# Patient Record
Sex: Male | Born: 1975 | Race: Black or African American | Hispanic: No | Marital: Single | Smoking: Current every day smoker
Health system: Southern US, Community
[De-identification: ages and names within clinical notes are randomized; demographics above are authoritative.]

---

## 2000-11-13 ENCOUNTER — Emergency Department (HOSPITAL_COMMUNITY): Admission: EM | Admit: 2000-11-13 | Discharge: 2000-11-13 | Payer: Self-pay | Admitting: Emergency Medicine

## 2003-10-28 ENCOUNTER — Emergency Department (HOSPITAL_COMMUNITY): Admission: AD | Admit: 2003-10-28 | Discharge: 2003-10-28 | Payer: Self-pay | Admitting: Family Medicine

## 2003-12-11 ENCOUNTER — Emergency Department (HOSPITAL_COMMUNITY): Admission: AD | Admit: 2003-12-11 | Discharge: 2003-12-11 | Payer: Self-pay | Admitting: Family Medicine

## 2005-08-12 ENCOUNTER — Emergency Department (HOSPITAL_COMMUNITY): Admission: EM | Admit: 2005-08-12 | Discharge: 2005-08-12 | Payer: Self-pay | Admitting: Family Medicine

## 2006-02-22 ENCOUNTER — Encounter: Admission: RE | Admit: 2006-02-22 | Discharge: 2006-02-22 | Payer: Self-pay | Admitting: Family Medicine

## 2006-02-27 ENCOUNTER — Ambulatory Visit (HOSPITAL_COMMUNITY): Admission: RE | Admit: 2006-02-27 | Discharge: 2006-02-27 | Payer: Self-pay | Admitting: Orthopedic Surgery

## 2006-03-13 ENCOUNTER — Inpatient Hospital Stay (HOSPITAL_COMMUNITY): Admission: RE | Admit: 2006-03-13 | Discharge: 2006-03-15 | Payer: Self-pay | Admitting: Psychiatry

## 2006-03-14 ENCOUNTER — Ambulatory Visit: Payer: Self-pay | Admitting: Psychiatry

## 2006-03-26 ENCOUNTER — Encounter: Admission: RE | Admit: 2006-03-26 | Discharge: 2006-03-26 | Payer: Self-pay | Admitting: Orthopedic Surgery

## 2007-09-20 ENCOUNTER — Emergency Department (HOSPITAL_COMMUNITY): Admission: EM | Admit: 2007-09-20 | Discharge: 2007-09-20 | Payer: Self-pay | Admitting: Emergency Medicine

## 2008-01-13 ENCOUNTER — Emergency Department (HOSPITAL_COMMUNITY): Admission: EM | Admit: 2008-01-13 | Discharge: 2008-01-13 | Payer: Self-pay | Admitting: Emergency Medicine

## 2008-09-23 ENCOUNTER — Emergency Department (HOSPITAL_COMMUNITY): Admission: EM | Admit: 2008-09-23 | Discharge: 2008-09-23 | Payer: Self-pay | Admitting: *Deleted

## 2008-10-27 ENCOUNTER — Emergency Department (HOSPITAL_COMMUNITY): Admission: EM | Admit: 2008-10-27 | Discharge: 2008-10-28 | Payer: Self-pay | Admitting: Emergency Medicine

## 2008-10-28 ENCOUNTER — Emergency Department (HOSPITAL_COMMUNITY): Admission: EM | Admit: 2008-10-28 | Discharge: 2008-10-28 | Payer: Self-pay | Admitting: Family Medicine
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
## 2011-02-23 NOTE — Op Note (Signed)
Danny English, Danny English       ACCOUNT NO.:  [id number]   MEDICAL RECORD NO.:  [id number]          PATIENT TYPE:  AMB   LOCATION:  SDS                          FACILITY:  [facility]   PHYSICIAN:  Myrtie Neither, MD      DATE OF BIRTH:  1975/11/05   DATE OF PROCEDURE:  02/27/2006  DATE OF DISCHARGE:                                 OPERATIVE REPORT   PREOPERATIVE DIAGNOSIS:  Fractured right fifth metacarpal.   POSTOPERATIVE DIAGNOSIS:  Fractured right fifth metacarpal.   ANESTHESIA:  General.   PROCEDURE:  Open reduction internal fixation with mini fragment five-hole  plate.   The patient was taking to the operating room, was given adequate  preoperative medications, given general anesthesia and intubated.  Right  hand was prepped with DuraPrep and draped in a sterile manner.  Tourniquet  and bipolar used for hemostasis.  Longitudinal incision was made over the  fifth metacarpal.  Sharp and blunt dissection made down to the fracture.  Manipulated reduction of the fracture site was done, stabilized with a K-  wire, followed by placement of dorsal mini-fragment plate with four screws.  This was visualized directly as well as with the mini C-arm.  Anatomic  reduction was possible.  Copious irrigation was done followed by wound  closure with 4-0 nylon.  Compressive dressing was applied and forearm splint  applied.  The patient tolerated the procedure quite well and went to  recovery room in stable and satisfactory position.  The patient is being  discharged home, ice packs, elevation, Percocet 5 mg q.4h. p.r.n. for pain,  return to the office in 1 week.      Myrtie Neither, MD  Electronically Signed     AC/MEDQ  D:  02/27/2006  T:  02/28/2006  Job:  [id number]

## 2011-04-27 ENCOUNTER — Inpatient Hospital Stay (INDEPENDENT_AMBULATORY_CARE_PROVIDER_SITE_OTHER)
Admission: RE | Admit: 2011-04-27 | Discharge: 2011-04-27 | Disposition: A | Discharge status: Home / Self Care | Payer: Medicare Other | Source: Ambulatory Visit | Attending: Family Medicine | Admitting: Family Medicine

## 2011-04-27 DIAGNOSIS — R350 Frequency of micturition: Secondary | ICD-10-CM

## 2011-04-27 LAB — POCT URINALYSIS DIP (DEVICE)
Bilirubin Urine: NEGATIVE
Leukocytes, UA: NEGATIVE
Specific Gravity, Urine: 1.025 (ref 1.005–1.030)
Urobilinogen, UA: 1 mg/dL (ref 0.0–1.0)

## 2011-10-05 ENCOUNTER — Emergency Department (HOSPITAL_COMMUNITY): Admission: EM | Admit: 2011-10-05 | Discharge: 2011-10-05 | Payer: Self-pay | Source: Home / Self Care

## 2015-11-22 DIAGNOSIS — F319 Bipolar disorder, unspecified: Secondary | ICD-10-CM | POA: Diagnosis not present

## 2015-11-22 DIAGNOSIS — J300 Vasomotor rhinitis: Secondary | ICD-10-CM | POA: Diagnosis not present

## 2015-11-22 DIAGNOSIS — F17200 Nicotine dependence, unspecified, uncomplicated: Secondary | ICD-10-CM | POA: Diagnosis not present

## 2015-11-22 DIAGNOSIS — F209 Schizophrenia, unspecified: Secondary | ICD-10-CM | POA: Diagnosis not present

## 2017-12-28 ENCOUNTER — Other Ambulatory Visit: Payer: Self-pay

## 2017-12-28 ENCOUNTER — Emergency Department (HOSPITAL_COMMUNITY): Payer: Medicare Other

## 2017-12-28 ENCOUNTER — Emergency Department (HOSPITAL_COMMUNITY)
Admission: EM | Admit: 2017-12-28 | Discharge: 2017-12-28 | Disposition: A | Discharge status: Home / Self Care | Payer: Medicare Other | Attending: Emergency Medicine | Admitting: Emergency Medicine

## 2017-12-28 ENCOUNTER — Encounter (HOSPITAL_COMMUNITY): Payer: Self-pay | Admitting: *Deleted

## 2017-12-28 DIAGNOSIS — R109 Unspecified abdominal pain: Secondary | ICD-10-CM

## 2017-12-28 DIAGNOSIS — R1909 Other intra-abdominal and pelvic swelling, mass and lump: Secondary | ICD-10-CM | POA: Diagnosis not present

## 2017-12-28 DIAGNOSIS — R1030 Lower abdominal pain, unspecified: Secondary | ICD-10-CM | POA: Insufficient documentation

## 2017-12-28 DIAGNOSIS — R030 Elevated blood-pressure reading, without diagnosis of hypertension: Secondary | ICD-10-CM | POA: Insufficient documentation

## 2017-12-28 DIAGNOSIS — R1032 Left lower quadrant pain: Secondary | ICD-10-CM

## 2017-12-28 LAB — CBC WITH DIFFERENTIAL/PLATELET
BASOS ABS: 0.1 10*3/uL (ref 0.0–0.1)
Basophils Relative: 0 %
EOS ABS: 0.7 10*3/uL (ref 0.0–0.7)
EOS PCT: 5 %
HCT: 43.7 % (ref 39.0–52.0)
Hemoglobin: 14.3 g/dL (ref 13.0–17.0)
Lymphocytes Relative: 21 %
Lymphs Abs: 2.6 10*3/uL (ref 0.7–4.0)
MCH: 30.1 pg (ref 26.0–34.0)
MCHC: 32.7 g/dL (ref 30.0–36.0)
MCV: 92 fL (ref 78.0–100.0)
MONO ABS: 0.9 10*3/uL (ref 0.1–1.0)
Monocytes Relative: 8 %
Neutro Abs: 8.2 10*3/uL — ABNORMAL HIGH (ref 1.7–7.7)
Neutrophils Relative %: 66 %
PLATELETS: 282 10*3/uL (ref 150–400)
RBC: 4.75 MIL/uL (ref 4.22–5.81)
RDW: 14.1 % (ref 11.5–15.5)
WBC: 12.5 10*3/uL — AB (ref 4.0–10.5)

## 2017-12-28 LAB — BASIC METABOLIC PANEL
Anion gap: 12 (ref 5–15)
BUN: 11 mg/dL (ref 6–20)
CO2: 23 mmol/L (ref 22–32)
CREATININE: 1.12 mg/dL (ref 0.61–1.24)
Calcium: 9.4 mg/dL (ref 8.9–10.3)
Chloride: 104 mmol/L (ref 101–111)
GFR calc Af Amer: >60 mL/min (ref >60)
GFR calc non Af Amer: >60 mL/min (ref >60)
GLUCOSE: 90 mg/dL (ref 65–99)
Potassium: 3.7 mmol/L (ref 3.5–5.1)
SODIUM: 139 mmol/L (ref 135–145)

## 2017-12-28 LAB — URINALYSIS, ROUTINE W REFLEX MICROSCOPIC
Bilirubin Urine: NEGATIVE
GLUCOSE, UA: NEGATIVE mg/dL
HGB URINE DIPSTICK: NEGATIVE
KETONES UR: NEGATIVE mg/dL
LEUKOCYTES UA: NEGATIVE
Nitrite: NEGATIVE
PROTEIN: NEGATIVE mg/dL
Specific Gravity, Urine: 1.019 (ref 1.005–1.030)
pH: 6 (ref 5.0–8.0)

## 2017-12-28 MED ORDER — CEFTRIAXONE SODIUM 250 MG IJ SOLR
250.0000 mg | Freq: Once | INTRAMUSCULAR | Status: AC
  Administered 2017-12-28: 250 mg via INTRAMUSCULAR
  Filled 2017-12-28: qty 250

## 2017-12-28 MED ORDER — METHOCARBAMOL 500 MG PO TABS: 500.0000 mg | ORAL_TABLET | Freq: Two times a day (BID) | ORAL | 0 refills | Status: DC

## 2017-12-28 MED ORDER — IOPAMIDOL (ISOVUE-300) INJECTION 61%
INTRAVENOUS | Status: AC
  Administered 2017-12-28: 100 mL
  Filled 2017-12-28: qty 100

## 2017-12-28 MED ORDER — KETOROLAC TROMETHAMINE 30 MG/ML IJ SOLN
30.0000 mg | Freq: Once | INTRAMUSCULAR | Status: AC
  Administered 2017-12-28: 30 mg via INTRAVENOUS
  Filled 2017-12-28: qty 1

## 2017-12-28 MED ORDER — LIDOCAINE HCL (PF) 1 % IJ SOLN
INTRAMUSCULAR | Status: AC
  Filled 2017-12-28: qty 5

## 2017-12-28 MED ORDER — DOXYCYCLINE HYCLATE 100 MG PO CAPS: 100.0000 mg | ORAL_CAPSULE | Freq: Two times a day (BID) | ORAL | 0 refills | Status: AC

## 2017-12-28 NOTE — ED Triage Notes (Signed)
Pt c/o L groin pain onset last night. Denies any heavy lifting; no urinary issues. No hx of hernias or kidney stones

## 2017-12-28 NOTE — Discharge Instructions (Addendum)
Please see the information and instructions below regarding your visit.  Your diagnoses today include:  1. Left inguinal pain   2. Flank pain   3. Elevated blood pressure reading without diagnosis of hypertension    Tests performed today include: See side panel of your discharge paperwork for testing performed today. Vital signs are listed at the bottom of these instructions.   Your CT scan is normal today with the exception of a slightly enlarged prostate.  Please see your results.  Medications prescribed:    Take any prescribed medications only as prescribed, and any over the counter medications only as directed on the packaging.  Please take all of your antibiotics until finished.   You may develop abdominal discomfort or nausea from the antibiotic. If this occurs, you may take it with food. Some patients also get diarrhea with antibiotics. You may help offset this with probiotics which you can buy or get in yogurt. Do not eat or take the probiotics until 2 hours after your antibiotic. Some women develop vaginal yeast infections after antibiotics. If you develop unusual vaginal discharge after being on this medication, please see your primary care provider.   Some people develop allergies to antibiotics. Symptoms of antibiotic allergy can be mild and include a flat rash and itching. They can also be more serious and include:  ?Hives - Hives are raised, red patches of skin that are usually very itchy.  ?Lip or tongue swelling  ?Trouble swallowing or breathing  ?Blistering of the skin or mouth.  If you have any of these serious symptoms, please seek emergency medical care immediately.   You are prescribed Robaxin, a muscle relaxant. Some common side effects of this medication include:  Feeling sleepy.  Dizziness. Take care upon going from a seated to a standing position.  Dry mouth.  Feeling tired or weak.  Hard stools (constipation).  Upset stomach. These are not all of the  side effects that may occur. If you have questions about side effects, call your doctor. Call your primary care provider for medical advice about side effects.  This medication can be sedating. Only take this medication as needed. Please do not combine with alcohol. Do not drive or operate machinery while taking this medication.   This medication can interact with some other medications. Make sure to tell any provider you are taking this medication before they prescribe you a new medication.    Home care instructions:   Low back pain gets worse the longer you stay stationary. Please keep moving and walking as tolerated. There are exercises included in this packet to perform as tolerated for your low back pain.   Apply heat to the areas that are painful. Avoid twisting or bending your trunk to lift something. Do not lift anything above 25 lbs while recovering from this flare of low back pain.  Please follow any educational materials contained in this packet.   You may also use Salon PAS patches on your back.  Follow-up instructions: Please follow-up with your primary care provider as soon as possible for further evaluation of your symptoms if they are not completely improved.   To find a primary care or specialty doctor please call [phone] or [phone] to access " Find a Doctor Service."  You may also go on the [facility] website at [email or web address]  There are also multiple [facility] practices throughout the [place] that are frequently accepting new patients. You may find a clinic that is close  to your home and contact them.  [facility] [address] [place] [phone]  [facility] in [place] (also locations in [place] and [place]) - [address] [place] [phone]  [facility] - [address] [place]  [phone]   Please follow up with  Dr. Liliane ShiWinter in Urology regarding your prostate enlargement.  Return instructions:  Please return to the Emergency Department if you experience worsening symptoms.  Please return for any fever or chills in the setting of your back pain, weakness in the muscles of the legs, numbness in your legs and feet that is new or changing, numbness in the area where you wipe, retention of your urine, loss of bowel or bladder control, or problems with walking. Please return with any pain with bowel movements, swelling in your testicles, or worsening lower belly pain. Please return if you have any other emergent concerns.  Additional Information:   Your vital signs today were: BP (!) 166/102    Pulse 80    Resp 16    SpO2 99%  If your blood pressure (BP) was elevated on multiple readings during this visit above 130 for the top number or above 80 for the bottom number, please have this repeated by your primary care provider within one month. --------------  Thank you for allowing us to participate in your care today.

## 2017-12-28 NOTE — ED Provider Notes (Signed)
[facility] Provider Note   CSN: [id number] Arrival date & time: 12/28/17  [id number]     History   Chief Complaint Chief Complaint  Patient presents with  . Groin Pain    HPI Danny English is a 42 y.o. male.  HPI  Patient is a 42 year old male with a history of psychotic disorder (denies any current treatment regimen) presenting for left groin pain.  Patient reports he has had intermittent left flank to left groin pain for the past 3 months, however last night it became severe during sleep.  Patient reports that movement makes the pain worse.  Patient denies any abdominal pain, dysuria, testicular pain, swelling or erythema, or rectal pain.  Patient denies nausea or vomiting.  Patient denies fever or chills.  Patient denies hematuria.  Patient reports he is monogamous with one male sexual partner.  Patient denies any saddle anesthesia, loss of bowel or bladder control, extremity weakness or numbness, IVDU, or history of cancer.  History reviewed. No pertinent past medical history.  There are no active problems to display for this patient.   History reviewed. No pertinent surgical history.      Home Medications    Prior to Admission medications   Not on File    Family History No family history on file.  Social History Social History   Tobacco Use  . Smoking status: Never Smoker  . Smokeless tobacco: Never Used  Substance Use Topics  . Alcohol use: Yes  . Drug use: Yes    Types: Marijuana     Allergies   Patient has no known allergies.   Review of Systems Review of Systems  Constitutional: Negative for chills and fever.  Respiratory: Negative for shortness of breath.   Cardiovascular: Negative for chest pain.  Gastrointestinal: Negative for abdominal pain, nausea and vomiting.  Genitourinary: Positive for flank pain. Negative for discharge, hematuria, penile swelling, scrotal swelling, testicular pain and  urgency.  Musculoskeletal: Positive for arthralgias and back pain. Negative for gait problem.  Neurological: Negative for weakness and numbness.  All other systems reviewed and are negative.    Physical Exam Updated Vital Signs BP (!) 171/92   Pulse 85   Resp 16   SpO2 99%   Physical Exam  Constitutional: He appears well-developed and well-nourished. No distress.  HENT:  Head: Normocephalic and atraumatic.  Mouth/Throat: Oropharynx is clear and moist.  Eyes: Pupils are equal, round, and reactive to light. Conjunctivae and EOM are normal.  Neck: Normal range of motion. Neck supple.  Cardiovascular: Normal rate, regular rhythm, S1 normal and S2 normal.  No murmur heard. Pulmonary/Chest: Effort normal and breath sounds normal. He has no wheezes. He has no rales.  Abdominal: Soft. He exhibits no distension. There is no tenderness. There is no guarding.  Genitourinary:  Genitourinary Comments: Examination performed with nurse chaperone, Chestine Sporelark, present.  There are no masses or hernias of the inguinal region.  No direct or indirect hernias identified.  No masses or tenderness of bilateral testes.  No penile discharge or tenderness.  Musculoskeletal: Normal range of motion. He exhibits no edema or deformity.  Spine Exam: Inspection/Palpation: No midline tenderness to palpation of cervical, thoracic, or lumbar spine.  Mild left paraspinal muscular tenderness. Strength: 5/5 throughout LE bilaterally (hip flexion/extension, adduction/abduction; knee flexion/extension; foot dorsiflexion/plantarflexion, inversion/eversion; great toe inversion) Sensation: Intact to light touch in proximal and distal LE bilaterally Reflexes: 2+ quadriceps and achilles reflexes Normal and symmetric gait.   Neurological: He  is alert.  Cranial nerves grossly intact. Patient moves extremities symmetrically and with good coordination.  Skin: Skin is warm and dry. No rash noted. No erythema.  Psychiatric: He has a  normal mood and affect. His behavior is normal. Judgment and thought content normal.  Nursing note and vitals reviewed.    ED Treatments / Results  Labs (all labs ordered are listed, but only abnormal results are displayed) Labs Reviewed - No data to display  EKG None  Radiology No results found.  Procedures Procedures (including critical care time)  Medications Ordered in ED Medications - No data to display   Initial Impression / Assessment and Plan / ED Course  I have reviewed the triage vital signs and the nursing notes.  Pertinent labs & imaging results that were available during my care of the patient were reviewed by me and considered in my medical decision making (see chart for details).  Clinical Course as of Dec 29 1250  Sat Dec 28, 2017  1042 Reassessed.  Patient reports he did have some improvement with Toradol, but made him nauseous.  Patient reports he still has some pain in the groin with movement.   [AM]  3674 42 year old, here in the ED with some left-sided groin pain.  His urinalysis and lab work were unremarkable other than slightly elevated WBCs.  He is nontoxic-appearing.  He had a CT which just demonstrated some prostatic enlargement.  Will need pain control plus minus antibiotics and outpatient follow-up with PCP/urology.   [MB]  1251 Return precautions were given for any weakness in the extremities, numbness, saddle anesthesia, loss of bowel or bladder control, fevers, chills, lower abdominal pain increasing, or testicular pain.   [AM]    Clinical Course User Index [AM] Elisha Ponder, PA-C [MB] Terrilee Files, MD    Patient is nontoxic-appearing, afebrile, in no acute distress.  Differential diagnosis includes nephrolithiasis, inguinal hernia, inguinal lymphadenopathy, musculoskeletal strain, nerve entrapment of femoral region, STI, lumbar radiculopathy.  Do not suspect cauda equina or abscess as cause of patient's symptoms given chronicity as  well as nontoxic appearance.  Patient is also neurologically intact in the lower extremities.  Patient has a slight leukocytosis of 12.5 with no clear source.  Slight left shift with 8.2.  Given these findings, in conjunction with patient's symptoms and prostatic enlargement on CT, will treat for prostatitis.  Patient was then apparently evaluated by Dr. Meridee Score, who agrees.  Patient to follow-up with urology.  Also placed case management consult to assist patient in finding a primary care provider, as his is no longer in practice.   Blood pressure noted to be elevated today.  I noted this to patient, and he will follow-up with primary care.  This is a shared visit with Dr. Meridee Score. Patient was independently evaluated by this attending physician. Attending physician consulted in evaluation and discharge management.  Final Clinical Impressions(s) / ED Diagnoses   Final diagnoses:  Flank pain  Left inguinal pain  Elevated blood pressure reading without diagnosis of hypertension    ED Discharge Orders        Ordered    doxycycline (VIBRAMYCIN) 100 MG capsule  2 times daily     12/28/17 1245    methocarbamol (ROBAXIN) 500 MG tablet  2 times daily     12/28/17 1245       Delia Chimes 12/28/17 1253    Terrilee Files, MD 12/30/17 1349

## 2017-12-29 LAB — URINE CULTURE: Culture: NO GROWTH

## 2018-01-20 DIAGNOSIS — N50812 Left testicular pain: Secondary | ICD-10-CM | POA: Diagnosis not present

## 2018-01-20 DIAGNOSIS — R351 Nocturia: Secondary | ICD-10-CM | POA: Diagnosis not present

## 2018-01-20 DIAGNOSIS — N401 Enlarged prostate with lower urinary tract symptoms: Secondary | ICD-10-CM | POA: Diagnosis not present

## 2018-01-20 DIAGNOSIS — R3912 Poor urinary stream: Secondary | ICD-10-CM | POA: Diagnosis not present

## 2018-01-21 ENCOUNTER — Ambulatory Visit (INDEPENDENT_AMBULATORY_CARE_PROVIDER_SITE_OTHER): Payer: Medicare Other | Admitting: Nurse Practitioner

## 2018-01-21 ENCOUNTER — Other Ambulatory Visit: Payer: Self-pay

## 2018-01-21 ENCOUNTER — Encounter (INDEPENDENT_AMBULATORY_CARE_PROVIDER_SITE_OTHER): Payer: Self-pay | Admitting: Nurse Practitioner

## 2018-01-21 VITALS — BP 130/84 | HR 98 | Temp 99.8°F | Ht 65.75 in | Wt 228.2 lb

## 2018-01-21 DIAGNOSIS — R1032 Left lower quadrant pain: Secondary | ICD-10-CM

## 2018-01-21 DIAGNOSIS — B9789 Other viral agents as the cause of diseases classified elsewhere: Secondary | ICD-10-CM

## 2018-01-21 DIAGNOSIS — J029 Acute pharyngitis, unspecified: Secondary | ICD-10-CM

## 2018-01-21 DIAGNOSIS — J028 Acute pharyngitis due to other specified organisms: Secondary | ICD-10-CM

## 2018-01-21 DIAGNOSIS — F17200 Nicotine dependence, unspecified, uncomplicated: Secondary | ICD-10-CM | POA: Insufficient documentation

## 2018-01-21 LAB — POCT RAPID STREP A (OFFICE): RAPID STREP A SCREEN: NEGATIVE

## 2018-01-21 NOTE — Patient Instructions (Addendum)
Health Maintenance, Male A healthy lifestyle and preventive care is important for your health and wellness. Ask your health care provider about what schedule of regular examinations is right for you. What should I know about weight and diet? Eat a Healthy Diet  Eat plenty of vegetables, fruits, whole grains, low-fat dairy products, and lean protein.  Do not eat a lot of foods high in solid fats, added sugars, or salt.  Maintain a Healthy Weight Regular exercise can help you achieve or maintain a healthy weight. You should:  Do at least 150 minutes of exercise each week. The exercise should increase your heart rate and make you sweat (moderate-intensity exercise).  Do strength-training exercises at least twice a week.  Watch Your Levels of Cholesterol and Blood Lipids  Have your blood tested for lipids and cholesterol every 5 years starting at 42 years of age. If you are at high risk for heart disease, you should start having your blood tested when you are 42 years old. You may need to have your cholesterol levels checked more often if: ? Your lipid or cholesterol levels are high. ? You are older than 42 years of age. ? You are at high risk for heart disease.  What should I know about cancer screening? Many types of cancers can be detected early and may often be prevented. Lung Cancer  You should be screened every year for lung cancer if: ? You are a current smoker who has smoked for at least 30 years. ? You are a former smoker who has quit within the past 15 years.  Talk to your health care provider about your screening options, when you should start screening, and how often you should be screened.  Colorectal Cancer  Routine colorectal cancer screening usually begins at 42 years of age and should be repeated every 5-10 years until you are 42 years old. You may need to be screened more often if early forms of precancerous polyps or small growths are found. Your health care provider  may recommend screening at an earlier age if you have risk factors for colon cancer.  Your health care provider may recommend using home test kits to check for hidden blood in the stool.  A small camera at the end of a tube can be used to examine your colon (sigmoidoscopy or colonoscopy). This checks for the earliest forms of colorectal cancer.  Prostate and Testicular Cancer  Depending on your age and overall health, your health care provider may do certain tests to screen for prostate and testicular cancer.  Talk to your health care provider about any symptoms or concerns you have about testicular or prostate cancer.  Skin Cancer  Check your skin from head to toe regularly.  Tell your health care provider about any new moles or changes in moles, especially if: ? There is a change in a mole's size, shape, or color. ? You have a mole that is larger than a pencil eraser.  Always use sunscreen. Apply sunscreen liberally and repeat throughout the day.  Protect yourself by wearing long sleeves, pants, a wide-brimmed hat, and sunglasses when outside.  What should I know about heart disease, diabetes, and high blood pressure?  If you are 18-39 years of age, have your blood pressure checked every 3-5 years. If you are 40 years of age or older, have your blood pressure checked every year. You should have your blood pressure measured twice-once when you are at a hospital or clinic, and once when   you are not at a hospital or clinic. Record the average of the two measurements. To check your blood pressure when you are not at a hospital or clinic, you can use: ? An automated blood pressure machine at a pharmacy. ? A home blood pressure monitor.  Talk to your health care provider about your target blood pressure.  If you are between 6945-708 years old, ask your health care provider if you should take aspirin to prevent heart disease.  Have regular diabetes screenings by checking your fasting blood  sugar level. ? If you are at a normal weight and have a low risk for diabetes, have this test once every three years after the age of 845. ? If you are overweight and have a high risk for diabetes, consider being tested at a younger age or more often.  A one-time screening for abdominal aortic aneurysm (AAA) by ultrasound is recommended for men aged 65-75 years who are current or former smokers. What should I know about preventing infection? Hepatitis B If you have a higher risk for hepatitis B, you should be screened for this virus. Talk with your health care provider to find out if you are at risk for hepatitis B infection. Hepatitis C Blood testing is recommended for:  Everyone born from [birth year] through 1965.  Anyone with known risk factors for hepatitis C.  Sexually Transmitted Diseases (STDs)  You should be screened each year for STDs including gonorrhea and chlamydia if: ? You are sexually active and are younger than 42 years of age. ? You are older than 42 years of age and your health care provider tells you that you are at risk for this type of infection. ? Your sexual activity has changed since you were last screened and you are at an increased risk for chlamydia or gonorrhea. Ask your health care provider if you are at risk.  Talk with your health care provider about whether you are at high risk of being infected with HIV. Your health care provider may recommend a prescription medicine to help prevent HIV infection.  What else can I do?  Schedule regular health, dental, and eye exams.  Stay current with your vaccines (immunizations).  Do not use any tobacco products, such as cigarettes, chewing tobacco, and e-cigarettes. If you need help quitting, ask your health care provider.  Limit alcohol intake to no more than 2 drinks per day. One drink equals 12 ounces of beer, 5 ounces of wine, or 1 ounces of hard liquor.  Do not use street drugs.  Do not share needles.  Ask your  health care provider for help if you need support or information about quitting drugs.  Tell your health care provider if you often feel depressed.  Tell your health care provider if you have ever been abused or do not feel safe at home. This information is not intended to replace advice given to you by your health care provider. Make sure you discuss any questions you have with your health care provider. Document Released: 03/22/2008 Document Revised: 05/23/2016 Document Reviewed: 06/28/2015 Elsevier Interactive Patient Education  2018 [employer]  Sore Throat When you have a sore throat, your throat may:  Hurt.  Burn.  Feel irritated.  Feel scratchy.  Many things can cause a sore throat, including:  An infection.  Allergies.  Dryness in the air.  Smoke or pollution.  Gastroesophageal reflux disease (GERD).  A tumor.  A sore throat can be the first sign of another sickness.  It can happen with other problems, like coughing or a fever. Most sore throats go away without treatment. Follow these instructions at home:  Take over-the-counter medicines only as told by your doctor.  Drink enough fluids to keep your pee (urine) clear or pale yellow.  Rest when you feel you need to.  To help with pain, try: ? Sipping warm liquids, such as broth, herbal tea, or warm water. ? Eating or drinking cold or frozen liquids, such as frozen ice pops. ? Gargling with a salt-water mixture 3-4 times a day or as needed. To make a salt-water mixture, add -1 tsp of salt in 1 cup of warm water. Mix it until you cannot see the salt anymore. ? Sucking on hard candy or throat lozenges. ? Putting a cool-mist humidifier in your bedroom at night. ? Sitting in the bathroom with the door closed for 5-10 minutes while you run hot water in the shower.  Do not use any tobacco products, such as cigarettes, chewing tobacco, and e-cigarettes. If you need help quitting, ask your doctor. Contact a  doctor if:  You have a fever for more than 2-3 days.  You keep having symptoms for more than 2-3 days.  Your throat does not get better in 7 days.  You have a fever and your symptoms suddenly get worse. Get help right away if:  You have trouble breathing.  You cannot swallow fluids, soft foods, or your saliva.  You have swelling in your throat or neck that gets worse.  You keep feeling like you are going to throw up (vomit).  You keep throwing up. This information is not intended to replace advice given to you by your health care provider. Make sure you discuss any questions you have with your health care provider. Document Released: 07/03/2008 Document Revised: 05/20/2016 Document Reviewed: 07/15/2015 Elsevier Interactive Patient Education  [employer].

## 2018-01-21 NOTE — Progress Notes (Signed)
Assessment & Plan:  [name] was seen today for hospitalization follow-up.  Diagnoses and all orders for this visit:  Left inguinal pain Continue follow up with Urology as instructed   Tobacco dependence Danny English was counseled on the dangers of tobacco use, and was advised to quit. Reviewed strategies to maximize success, including removing cigarettes and smoking materials from environment, stress management and support of family/friends as well as pharmacological alternatives including: Wellbutrin, Chantix, Nicotine patch, Nicotine gum or lozenges. Smoking cessation support: smoking cessation hotline: 1-800-QUIT-NOW.  Smoking cessation classes are also available through [facility]. Call [phone] or visit our website at [email or web address].   A total of 3 minutes was spent on counseling for smoking cessation and Danny English is not ready to quit.   Sore throat (viral) -     Rapid Strep A Warm salt water gargles for pain relief May alternate with acetaminophen and ibuprofen for symptom relief     Patient has been counseled on age-appropriate routine health concerns for screening and prevention. These are reviewed and up-to-date. Referrals have been placed accordingly. Immunizations are up-to-date or declined.    Subjective:   Chief Complaint  Patient presents with  . Hospitalization Follow-up    left inguinal pain/ elevated BP   HPI [place] Danny English 42 y.o. male presents to office today for hospital follow up to groin pain with onset over 3 months prior to ED visit on 12-28-2017. He also endorses myalgias, cough, sore throat. He has taken XS Tylenol OTC for pain relief.  Groin Pain CT in ED showed prostatic enlargement. He was given pain medication, antibiotics and instructed to follow up with Primary care/Urology. Today he reports his inguinal pain has improved and he is currently seeing Urology and taking tamsulosin.   Sore  Throat Patient complains of sore throat. Symptoms began 4 days ago. Pain is of moderate severity. Fever is believed to be present, temp not taken. Other associated symptoms have included cough, nasal congestion, myalgias.  Fluid intake is good.  There has not been contact with an individual with known strep.  Current medications include acetaminophen, multi-symptom cold medications.       Review of Systems  Constitutional: Positive for fever and malaise/fatigue. Negative for weight loss.  HENT: Positive for congestion, hearing loss and sore throat. Negative for nosebleeds.   Eyes: Negative.  Negative for blurred vision, double vision and photophobia.  Respiratory: Positive for cough. Negative for shortness of breath.   Cardiovascular: Negative.  Negative for chest pain, palpitations and leg swelling.  Gastrointestinal: Negative.  Negative for heartburn, nausea and vomiting.  Musculoskeletal: Positive for myalgias.  Neurological: Negative.  Negative for dizziness, focal weakness, seizures and headaches.  Psychiatric/Behavioral: Negative.  Negative for suicidal ideas.    No past medical history on file.  No past surgical history on file.  Family History  Problem Relation Age of Onset  . Cancer Mother   . Breast cancer Mother     Social History Reviewed with no changes to be made today.   Outpatient Medications Prior to Visit  Medication Sig Dispense Refill  . doxycycline (VIBRAMYCIN) 100 MG capsule Take 100 mg by mouth 2 (two) times daily.    . methocarbamol (ROBAXIN) 500 MG tablet Take 1 tablet (500 mg total) by mouth 2 (two) times daily. 14 tablet 0  . tamsulosin (FLOMAX) 0.4 MG CAPS capsule Take 0.4 mg by mouth daily.     No facility-administered medications prior to visit.  No Known Allergies     Objective:    BP 130/84 (BP Location: Right Arm, Patient Position: Sitting, Cuff Size: Large)   Pulse 98   Temp 99.8 F (37.7 C) (Oral)   Wt 228 lb 3.2 oz (103.5 kg)    SpO2 95%  Wt Readings from Last 3 Encounters:  01/21/18 228 lb 3.2 oz (103.5 kg)    Physical Exam  Constitutional: He is oriented to person, place, and time. He appears well-developed and well-nourished. He is cooperative.  HENT:  Head: Normocephalic and atraumatic.  Right Ear: Hearing, tympanic membrane, external ear and ear canal normal.  Left Ear: Hearing, tympanic membrane, external ear and ear canal normal.  Nose: Mucosal edema and rhinorrhea present.  Mouth/Throat: Oropharyngeal exudate, posterior oropharyngeal edema and posterior oropharyngeal erythema present.  Eyes: EOM are normal.  Neck: Normal range of motion.  Cardiovascular: Normal rate, regular rhythm and normal heart sounds. Exam reveals no gallop and no friction rub.  No murmur heard. Pulmonary/Chest: Effort normal and breath sounds normal. No tachypnea. No respiratory distress. He has no decreased breath sounds. He has no wheezes. He has no rhonchi. He has no rales. He exhibits no tenderness.  Abdominal: Soft. Bowel sounds are normal.  Musculoskeletal: Normal range of motion. He exhibits no edema.  Neurological: He is alert and oriented to person, place, and time. Coordination normal.  Skin: Skin is warm and dry.  Psychiatric: He has a normal mood and affect. His behavior is normal. Judgment and thought content normal.  Nursing note and vitals reviewed.        Patient has been counseled extensively about nutrition and exercise as well as the importance of adherence with medications and regular follow-up. The patient was given clear instructions to go to ER or return to medical center if symptoms don't improve, worsen or new problems develop. The patient verbalized understanding.   Follow-up: Return in about 3 months (around 04/22/2018) for FASTING labs and Physical.   Claiborne RiggZelda W May Manrique, FNP-BC [facility] [place], [place] [phone]   01/21/2018, 3:10 PM

## 2018-03-11 DIAGNOSIS — R3912 Poor urinary stream: Secondary | ICD-10-CM | POA: Diagnosis not present

## 2018-03-11 DIAGNOSIS — N401 Enlarged prostate with lower urinary tract symptoms: Secondary | ICD-10-CM | POA: Diagnosis not present

## 2018-03-11 DIAGNOSIS — R351 Nocturia: Secondary | ICD-10-CM | POA: Diagnosis not present

## 2018-04-22 ENCOUNTER — Other Ambulatory Visit: Payer: Self-pay

## 2018-04-22 ENCOUNTER — Ambulatory Visit (INDEPENDENT_AMBULATORY_CARE_PROVIDER_SITE_OTHER): Payer: Medicare Other | Admitting: Physician Assistant

## 2018-04-22 ENCOUNTER — Encounter (INDEPENDENT_AMBULATORY_CARE_PROVIDER_SITE_OTHER): Payer: Self-pay | Admitting: Physician Assistant

## 2018-04-22 VITALS — BP 149/90 | HR 71 | Temp 98.1°F | Ht 65.0 in | Wt 228.4 lb

## 2018-04-22 DIAGNOSIS — Z23 Encounter for immunization: Secondary | ICD-10-CM | POA: Diagnosis not present

## 2018-04-22 DIAGNOSIS — R030 Elevated blood-pressure reading, without diagnosis of hypertension: Secondary | ICD-10-CM

## 2018-04-22 DIAGNOSIS — Z0000 Encounter for general adult medical examination without abnormal findings: Secondary | ICD-10-CM | POA: Diagnosis not present

## 2018-04-22 DIAGNOSIS — Z114 Encounter for screening for human immunodeficiency virus [HIV]: Secondary | ICD-10-CM

## 2018-04-22 NOTE — Patient Instructions (Signed)

## 2018-04-22 NOTE — Progress Notes (Signed)
   Subjective:  Patient ID: Danny English, male    DOB: 05/28/1976  Age: 42 y.o. MRN: [id number]  CC: annual exam  HPI Danny English is a 42 y.o. male with no significant medical history presents for an annual physical. States he is feeling well. Denies any pains, symptoms, or complaints.       Outpatient Medications Prior to Visit  Medication Sig Dispense Refill  . doxycycline (VIBRAMYCIN) 100 MG capsule Take 100 mg by mouth 2 (two) times daily.    . methocarbamol (ROBAXIN) 500 MG tablet Take 1 tablet (500 mg total) by mouth 2 (two) times daily. 14 tablet 0  . tamsulosin (FLOMAX) 0.4 MG CAPS capsule Take 0.4 mg by mouth daily.     No facility-administered medications prior to visit.      ROS Review of Systems  Constitutional: Negative for chills, fever and malaise/fatigue.  Eyes: Negative for blurred vision.  Respiratory: Negative for shortness of breath.   Cardiovascular: Negative for chest pain and palpitations.  Gastrointestinal: Negative for abdominal pain and nausea.  Genitourinary: Negative for dysuria and hematuria.  Musculoskeletal: Negative for joint pain and myalgias.  Skin: Negative for rash.  Neurological: Negative for tingling and headaches.  Psychiatric/Behavioral: Negative for depression. The patient is not nervous/anxious.     Objective:  BP (!) 149/90 (BP Location: Right Arm, Patient Position: Supine, Cuff Size: Large)   Pulse 71   Temp 98.1 F (36.7 C) (Oral)   Ht 5\' 5"  (1.651 m)   Wt 228 lb 6.4 oz (103.6 kg)   SpO2 96%   BMI 38.01 kg/m   BP/Weight 04/22/2018 01/21/2018 12/28/2017  Systolic BP 149 130 129  Diastolic BP 90 84 86  Wt. (Lbs) 228.4 228.2 -  BMI 38.01 37.12 -      Physical Exam  Constitutional: He is oriented to person, place, and time.  Well developed, obese, NAD, polite  HENT:  Head: Normocephalic and atraumatic.  Eyes: Conjunctivae and EOM are normal. No scleral icterus.  Neck: Normal range of motion. Neck  supple. No thyromegaly present.  Cardiovascular: Normal rate, regular rhythm, normal heart sounds and intact distal pulses. Exam reveals no gallop and no friction rub.  No murmur heard. Pulmonary/Chest: Effort normal and breath sounds normal. No stridor. No respiratory distress. He has no wheezes. He has no rales.  Abdominal: Soft. Bowel sounds are normal. He exhibits no distension and no mass. There is no tenderness. There is no rebound and no guarding.  Genitourinary:  Genitourinary Comments: Pt declined  Musculoskeletal: He exhibits no edema.  Normal aROM of UEs, LEs, and back.  Lymphadenopathy:    He has no cervical adenopathy.  Neurological: He is alert and oriented to person, place, and time. No cranial nerve deficit.  Strength 5/5 bilaterally   Skin: Skin is warm and dry. No rash noted. No erythema. No pallor.  Psychiatric: He has a normal mood and affect. His behavior is normal. Thought content normal.  Vitals reviewed.    Assessment & Plan:   1. Annual physical exam - Comprehensive metabolic panel - Lipid panel; Future  2. Screening for HIV (human immunodeficiency virus) - HIV antibody  3. Need for Tdap vaccination - Tdap vaccine greater than or equal to 7yo IM  4. Elevated blood pressure reading without diagnosis of hypertension     Follow-up: PRN, BP check   Loletta Specteroger David Adair Lemar PA

## 2018-04-23 ENCOUNTER — Telehealth (INDEPENDENT_AMBULATORY_CARE_PROVIDER_SITE_OTHER): Payer: Self-pay

## 2018-04-23 LAB — COMPREHENSIVE METABOLIC PANEL
ALBUMIN: 4.1 g/dL (ref 3.5–5.5)
ALT: 19 IU/L (ref 0–44)
AST: 21 IU/L (ref 0–40)
Albumin/Globulin Ratio: 1.7 (ref 1.2–2.2)
Alkaline Phosphatase: 96 IU/L (ref 39–117)
BUN / CREAT RATIO: 7 — AB (ref 9–20)
BUN: 9 mg/dL (ref 6–24)
Bilirubin Total: 0.3 mg/dL (ref 0.0–1.2)
CALCIUM: 9.1 mg/dL (ref 8.7–10.2)
CO2: 21 mmol/L (ref 20–29)
CREATININE: 1.22 mg/dL (ref 0.76–1.27)
Chloride: 104 mmol/L (ref 96–106)
GFR calc Af Amer: 84 mL/min/{1.73_m2} (ref >59)
GFR, EST NON AFRICAN AMERICAN: 73 mL/min/{1.73_m2} (ref >59)
GLOBULIN, TOTAL: 2.4 g/dL (ref 1.5–4.5)
Glucose: 81 mg/dL (ref 65–99)
Potassium: 3.8 mmol/L (ref 3.5–5.2)
SODIUM: 144 mmol/L (ref 134–144)
TOTAL PROTEIN: 6.5 g/dL (ref 6.0–8.5)

## 2018-04-23 LAB — HIV ANTIBODY (ROUTINE TESTING W REFLEX): HIV Screen 4th Generation wRfx: NONREACTIVE

## 2018-04-23 NOTE — Telephone Encounter (Signed)
-----   Message from Loletta Specteroger David Gomez, PA-C sent at 04/23/2018  1:34 PM EDT ----- HIV negative. Kidney and liver results normal.

## 2018-04-23 NOTE — Telephone Encounter (Signed)
Patient is aware of negative HIV and normal liver and kidney. Maryjean Mornempestt S Roberts, CMA

## 2018-05-27 ENCOUNTER — Ambulatory Visit (INDEPENDENT_AMBULATORY_CARE_PROVIDER_SITE_OTHER): Payer: Medicare Other | Admitting: Physician Assistant

## 2018-06-24 ENCOUNTER — Ambulatory Visit (INDEPENDENT_AMBULATORY_CARE_PROVIDER_SITE_OTHER): Payer: Medicare Other | Admitting: Physician Assistant

## 2018-06-24 ENCOUNTER — Other Ambulatory Visit: Payer: Self-pay

## 2018-06-24 ENCOUNTER — Encounter (INDEPENDENT_AMBULATORY_CARE_PROVIDER_SITE_OTHER): Payer: Self-pay | Admitting: Physician Assistant

## 2018-06-24 VITALS — BP 129/82 | HR 71 | Temp 98.1°F | Ht 65.0 in | Wt 234.4 lb

## 2018-06-24 DIAGNOSIS — Z0130 Encounter for examination of blood pressure without abnormal findings: Secondary | ICD-10-CM | POA: Diagnosis not present

## 2018-06-24 DIAGNOSIS — Z23 Encounter for immunization: Secondary | ICD-10-CM | POA: Diagnosis not present

## 2018-06-24 NOTE — Progress Notes (Signed)
   Subjective:  Patient ID: Danny English, male    DOB: 12/24/1975  Age: 42 y.o. MRN: [id number]  CC: f/u BP recheck  HPI Danny English is a [age] y.o. male with no significant medical history presents on f/u of elevated blood pressure reading without diagnosis of hypertension. Last BP 149/90 mmHg two months ago. BP 129/82 mmHg today. Says he is feeling well and does not endorse any symptoms or complaints.      ROS Review of Systems  Constitutional: Negative for chills, fever and malaise/fatigue.  Eyes: Negative for blurred vision.  Respiratory: Negative for shortness of breath.   Cardiovascular: Negative for chest pain and palpitations.  Gastrointestinal: Negative for abdominal pain and nausea.  Genitourinary: Negative for dysuria and hematuria.  Musculoskeletal: Negative for joint pain and myalgias.  Skin: Negative for rash.  Neurological: Negative for tingling and headaches.  Psychiatric/Behavioral: Negative for depression. The patient is not nervous/anxious.     Objective:  BP 134/86 (BP Location: Right Arm, Patient Position: Sitting, Cuff Size: Large)   Pulse 71   Temp 98.1 F (36.7 C) (Oral)   Ht 5\' 5"  (1.651 m)   Wt 234 lb 6.4 oz (106.3 kg)   SpO2 92%   BMI 39.01 kg/m   Vitals:   06/24/18 1429 06/24/18 1444  BP: 134/86 129/82  Pulse: 71   Temp: 98.1 F (36.7 C)   TempSrc: Oral   SpO2: 92%   Weight: 234 lb 6.4 oz (106.3 kg)   Height: 5\' 5"  (1.651 m)       Physical Exam  Constitutional: He is oriented to person, place, and time.  Well developed, obese, NAD, polite  HENT:  Head: Normocephalic and atraumatic.  Eyes: No scleral icterus.  Neck: Normal range of motion. Neck supple. No thyromegaly present.  Cardiovascular: Normal rate, regular rhythm and normal heart sounds.  Pulmonary/Chest: Effort normal and breath sounds normal.  Abdominal: Soft. Bowel sounds are normal. There is no tenderness.  Musculoskeletal: He exhibits no edema.   Neurological: He is alert and oriented to person, place, and time.  Skin: Skin is warm and dry. No rash noted. No erythema. No pallor.  Psychiatric: He has a normal mood and affect. His behavior is normal. Thought content normal.  Vitals reviewed.    Assessment & Plan:   1. Blood pressure check - Pt not hypertensive. BP 129/82 today. Advised to lose weight and eat a low sodium diet to further improve blood pressure.   2. Need for prophylactic vaccination and inoculation against influenza - Flu Vaccine QUAD 6+ mos PF IM (Fluarix Quad PF)    Follow-up: Return in about 10 months (around 04/24/2019) for annual physical.   Loletta Specteroger David Levon Boettcher PA

## 2018-06-24 NOTE — Patient Instructions (Signed)
DASH Eating Plan DASH stands for "Dietary Approaches to Stop Hypertension." The DASH eating plan is a healthy eating plan that has been shown to reduce high blood pressure (hypertension). It may also reduce your risk for type 2 diabetes, heart disease, and stroke. The DASH eating plan may also help with weight loss. What are tips for following this plan? General guidelines  Avoid eating more than 2,300 mg (milligrams) of salt (sodium) a day. If you have hypertension, you may need to reduce your sodium intake to 1,500 mg a day.  Limit alcohol intake to no more than 1 drink a day for nonpregnant women and 2 drinks a day for men. One drink equals 12 oz of beer, 5 oz of wine, or 1 oz of hard liquor.  Work with your health care provider to maintain a healthy body weight or to lose weight. Ask what an ideal weight is for you.  Get at least 30 minutes of exercise that causes your heart to beat faster (aerobic exercise) most days of the week. Activities may include walking, swimming, or biking.  Work with your health care provider or diet and nutrition specialist (dietitian) to adjust your eating plan to your individual calorie needs. Reading food labels  Check food labels for the amount of sodium per serving. Choose foods with less than 5 percent of the Daily Value of sodium. Generally, foods with less than 300 mg of sodium per serving fit into this eating plan.  To find whole grains, look for the word "whole" as the first word in the ingredient list. Shopping  Buy products labeled as "low-sodium" or "no salt added."  Buy fresh foods. Avoid canned foods and premade or frozen meals. Cooking  Avoid adding salt when cooking. Use salt-free seasonings or herbs instead of table salt or sea salt. Check with your health care provider or pharmacist before using salt substitutes.  Do not fry foods. Cook foods using healthy methods such as baking, boiling, grilling, and broiling instead.  Cook with  heart-healthy oils, such as olive, canola, soybean, or sunflower oil. Meal planning   Eat a balanced diet that includes: ? 5 or more servings of fruits and vegetables each day. At each meal, try to fill half of your plate with fruits and vegetables. ? Up to 6-8 servings of whole grains each day. ? Less than 6 oz of lean meat, poultry, or fish each day. A 3-oz serving of meat is about the same size as a deck of cards. One egg equals 1 oz. ? 2 servings of low-fat dairy each day. ? A serving of nuts, seeds, or beans 5 times each week. ? Heart-healthy fats. Healthy fats called Omega-3 fatty acids are found in foods such as flaxseeds and coldwater fish, like sardines, salmon, and mackerel.  Limit how much you eat of the following: ? Canned or prepackaged foods. ? Food that is high in trans fat, such as fried foods. ? Food that is high in saturated fat, such as fatty meat. ? Sweets, desserts, sugary drinks, and other foods with added sugar. ? Full-fat dairy products.  Do not salt foods before eating.  Try to eat at least 2 vegetarian meals each week.  Eat more home-cooked food and less restaurant, buffet, and fast food.  When eating at a restaurant, ask that your food be prepared with less salt or no salt, if possible. What foods are recommended? The items listed may not be a complete list. Talk with your dietitian about what   dietary choices are best for you. Grains Whole-grain or whole-wheat bread. Whole-grain or whole-wheat pasta. Brown rice. Oatmeal. Quinoa. Bulgur. Whole-grain and low-sodium cereals. Pita bread. Low-fat, low-sodium crackers. Whole-wheat flour tortillas. Vegetables Fresh or frozen vegetables (raw, steamed, roasted, or grilled). Low-sodium or reduced-sodium tomato and vegetable juice. Low-sodium or reduced-sodium tomato sauce and tomato paste. Low-sodium or reduced-sodium canned vegetables. Fruits All fresh, dried, or frozen fruit. Canned fruit in natural juice (without  added sugar). Meat and other protein foods Skinless chicken or turkey. Ground chicken or turkey. Pork with fat trimmed off. Fish and seafood. Egg whites. Dried beans, peas, or lentils. Unsalted nuts, nut butters, and seeds. Unsalted canned beans. Lean cuts of beef with fat trimmed off. Low-sodium, lean deli meat. Dairy Low-fat (1%) or fat-free (skim) milk. Fat-free, low-fat, or reduced-fat cheeses. Nonfat, low-sodium ricotta or cottage cheese. Low-fat or nonfat yogurt. Low-fat, low-sodium cheese. Fats and oils Soft margarine without trans fats. Vegetable oil. Low-fat, reduced-fat, or light mayonnaise and salad dressings (reduced-sodium). Canola, safflower, olive, soybean, and sunflower oils. Avocado. Seasoning and other foods Herbs. Spices. Seasoning mixes without salt. Unsalted popcorn and pretzels. Fat-free sweets. What foods are not recommended? The items listed may not be a complete list. Talk with your dietitian about what dietary choices are best for you. Grains Baked goods made with fat, such as croissants, muffins, or some breads. Dry pasta or rice meal packs. Vegetables Creamed or fried vegetables. Vegetables in a cheese sauce. Regular canned vegetables (not low-sodium or reduced-sodium). Regular canned tomato sauce and paste (not low-sodium or reduced-sodium). Regular tomato and vegetable juice (not low-sodium or reduced-sodium). Pickles. Olives. Fruits Canned fruit in a light or heavy syrup. Fried fruit. Fruit in cream or butter sauce. Meat and other protein foods Fatty cuts of meat. Ribs. Fried meat. Bacon. Sausage. Bologna and other processed lunch meats. Salami. Fatback. Hotdogs. Bratwurst. Salted nuts and seeds. Canned beans with added salt. Canned or smoked fish. Whole eggs or egg yolks. Chicken or turkey with skin. Dairy Whole or 2% milk, cream, and half-and-half. Whole or full-fat cream cheese. Whole-fat or sweetened yogurt. Full-fat cheese. Nondairy creamers. Whipped toppings.  Processed cheese and cheese spreads. Fats and oils Butter. Stick margarine. Lard. Shortening. Ghee. Bacon fat. Tropical oils, such as coconut, palm kernel, or palm oil. Seasoning and other foods Salted popcorn and pretzels. Onion salt, garlic salt, seasoned salt, table salt, and sea salt. Worcestershire sauce. Tartar sauce. Barbecue sauce. Teriyaki sauce. Soy sauce, including reduced-sodium. Steak sauce. Canned and packaged gravies. Fish sauce. Oyster sauce. Cocktail sauce. Horseradish that you find on the shelf. Ketchup. Mustard. Meat flavorings and tenderizers. Bouillon cubes. Hot sauce and Tabasco sauce. Premade or packaged marinades. Premade or packaged taco seasonings. Relishes. Regular salad dressings. Where to find more information:  National Heart, Lung, and Blood Institute: www.nhlbi.nih.gov  American Heart Association: www.heart.org Summary  The DASH eating plan is a healthy eating plan that has been shown to reduce high blood pressure (hypertension). It may also reduce your risk for type 2 diabetes, heart disease, and stroke.  With the DASH eating plan, you should limit salt (sodium) intake to 2,300 mg a day. If you have hypertension, you may need to reduce your sodium intake to 1,500 mg a day.  When on the DASH eating plan, aim to eat more fresh fruits and vegetables, whole grains, lean proteins, low-fat dairy, and heart-healthy fats.  Work with your health care provider or diet and nutrition specialist (dietitian) to adjust your eating plan to your individual   calorie needs. This information is not intended to replace advice given to you by your health care provider. Make sure you discuss any questions you have with your health care provider. Document Released: 09/13/2011 Document Revised: 09/17/2016 Document Reviewed: 09/17/2016 Elsevier Interactive Patient Education  2018 Elsevier Inc.  

## 2019-03-28 ENCOUNTER — Emergency Department (HOSPITAL_COMMUNITY): Payer: Medicare HMO

## 2019-03-28 ENCOUNTER — Emergency Department (HOSPITAL_COMMUNITY)
Admission: EM | Admit: 2019-03-28 | Discharge: 2019-03-28 | Disposition: A | Discharge status: Home / Self Care | Payer: Medicare HMO | Attending: Emergency Medicine | Admitting: Emergency Medicine

## 2019-03-28 ENCOUNTER — Other Ambulatory Visit: Payer: Self-pay

## 2019-03-28 ENCOUNTER — Encounter (HOSPITAL_COMMUNITY): Payer: Self-pay

## 2019-03-28 DIAGNOSIS — R0789 Other chest pain: Secondary | ICD-10-CM | POA: Diagnosis not present

## 2019-03-28 DIAGNOSIS — Z20828 Contact with and (suspected) exposure to other viral communicable diseases: Secondary | ICD-10-CM | POA: Insufficient documentation

## 2019-03-28 DIAGNOSIS — R69 Illness, unspecified: Secondary | ICD-10-CM | POA: Diagnosis not present

## 2019-03-28 DIAGNOSIS — R079 Chest pain, unspecified: Secondary | ICD-10-CM | POA: Diagnosis not present

## 2019-03-28 DIAGNOSIS — F17210 Nicotine dependence, cigarettes, uncomplicated: Secondary | ICD-10-CM | POA: Insufficient documentation

## 2019-03-28 LAB — BASIC METABOLIC PANEL
Anion gap: 3 — ABNORMAL LOW (ref 5–15)
BUN: 17 mg/dL (ref 6–20)
CO2: 29 mmol/L (ref 22–32)
Calcium: 9 mg/dL (ref 8.9–10.3)
Chloride: 110 mmol/L (ref 98–111)
Creatinine, Ser: 1.26 mg/dL — ABNORMAL HIGH (ref 0.61–1.24)
GFR calc Af Amer: >60 mL/min (ref >60)
GFR calc non Af Amer: >60 mL/min (ref >60)
Glucose, Bld: 76 mg/dL (ref 70–99)
Potassium: 4 mmol/L (ref 3.5–5.1)
Sodium: 142 mmol/L (ref 135–145)

## 2019-03-28 LAB — CBC
HCT: 46.3 % (ref 39.0–52.0)
Hemoglobin: 14.4 g/dL (ref 13.0–17.0)
MCH: 28.7 pg (ref 26.0–34.0)
MCHC: 31.1 g/dL (ref 30.0–36.0)
MCV: 92.2 fL (ref 80.0–100.0)
Platelets: 248 10*3/uL (ref 150–400)
RBC: 5.02 MIL/uL (ref 4.22–5.81)
RDW: 14.8 % (ref 11.5–15.5)
WBC: 7.8 10*3/uL (ref 4.0–10.5)
nRBC: 0 % (ref 0.0–0.2)

## 2019-03-28 LAB — TROPONIN I
Troponin I: <0.03 ng/mL (ref <0.03)
Troponin I: <0.03 ng/mL (ref <0.03)

## 2019-03-28 MED ORDER — DICLOFENAC SODIUM 1 % TD GEL: 2.0000 g | Freq: Four times a day (QID) | TRANSDERMAL | 0 refills | Status: AC

## 2019-03-28 MED ORDER — SODIUM CHLORIDE 0.9% FLUSH: 3.0000 mL | Freq: Once | INTRAVENOUS | Status: DC

## 2019-03-28 NOTE — Discharge Instructions (Addendum)
Use voltaren gel as needed for pain.  You are being tested for coronavirus. Results should return in about 2 days. If positive, you should receive a phone call. If negative, you will not. Either way, you may check online on MyChart.  You should quarantine until results return.  If positive, as long as you are fever free you can end your quarantine.  Follow up with your primary care doctor if your pain is not improving.  Stop using cocaine, this may be causing your chest pain to be worse.  Return to the ER with any new, worsening, or concerning symptoms.

## 2019-03-28 NOTE — ED Triage Notes (Signed)
He reports left-sided chest pain, intermittently x 1 week, which is gradually increasing in frequency and severity. He denies fever, nor any other sign of illness. He tells me that with certain torso movements, the discomfort increases. EKG performed at triage.

## 2019-03-29 NOTE — ED Provider Notes (Addendum)
[facility] DEPT Provider Note   CSN: [id number] Arrival date & time: 03/28/19  1521     History   Chief Complaint Chief Complaint  Patient presents with  . Chest Pain    HPI Danny English is a 43 y.o. male presenting for evaluation of cp.   Pt states for the past week has has been having intermittent CP.  Pain is more likely to occur when he is leaning forward, moving his arm, and moving his torso.  However it does occasionally occur at rest.  Pain lasts for anywhere from several seconds to a minute, then resolves without intervention.  Pain is always in the same location, on the left side.  He denies associated shortness of breath, nausea, vomiting, or diaphoresis.  He denies recent fevers, chills, cough, abdominal pain, urinary symptoms, normal bowel movements.  He denies a previous cardiac history.  He denies a family history of heart problems.  Patient smokes cigarettes daily.  He also reports marijuana and cocaine use, last used 2 days ago.  He reports occasional alcohol intake, no increase in this past week.  He denies change in activity recently, no increase with exertion.  No increased pain with inspiration or exertion.  He has not taken anything for pain.  He states he has no medical problems, takes no medications daily. No pain currently at rest.     HPI  History reviewed. No pertinent past medical history.  Patient Active Problem List   Diagnosis Date Noted  . Left inguinal pain 01/21/2018  . Tobacco dependence 01/21/2018    No past surgical history on file.      Home Medications    Prior to Admission medications   Medication Sig Start Date End Date Taking? Authorizing Provider  diclofenac sodium (VOLTAREN) 1 % GEL Apply 2 g topically 4 (four) times daily. 03/28/19   Obinna Ehresman, PA-C    Family History Family History  Problem Relation Age of Onset  . Cancer Mother   . Breast cancer Mother     Social History  Social History   Tobacco Use  . Smoking status: Current Every Day Smoker    Types: Cigarettes  . Smokeless tobacco: Never Used  Substance Use Topics  . Alcohol use: Yes  . Drug use: Yes    Types: Marijuana     Allergies   Patient has no known allergies.   Review of Systems Review of Systems  Cardiovascular: Positive for chest pain (Intermittent for the past week).  All other systems reviewed and are negative.    Physical Exam Updated Vital Signs BP (!) 142/87 (BP Location: Left Arm)   Pulse 86   Temp 98 F (36.7 C) (Oral)   Resp 18   SpO2 100%   Physical Exam Vitals signs and nursing note reviewed.  Constitutional:      General: He is not in acute distress.    Appearance: He is well-developed.     Comments: Resting comfortably in the bed in no acute distress  HENT:     Head: Normocephalic and atraumatic.  Eyes:     Conjunctiva/sclera: Conjunctivae normal.     Pupils: Pupils are equal, round, and reactive to light.  Neck:     Musculoskeletal: Normal range of motion and neck supple.  Cardiovascular:     Rate and Rhythm: Normal rate and regular rhythm.     Pulses: Normal pulses.  Pulmonary:     Effort: Pulmonary effort is normal. No respiratory distress.  Breath sounds: Normal breath sounds. No wheezing.     Comments: Speaking in full sentences.  Clear lung sounds in all fields.  No tenderness palpation of the chest wall. Chest:     Chest wall: No tenderness.  Abdominal:     General: There is no distension.     Palpations: Abdomen is soft. There is no mass.     Tenderness: There is no abdominal tenderness. There is no guarding or rebound.  Musculoskeletal: Normal range of motion.  Skin:    General: Skin is warm and dry.     Capillary Refill: Capillary refill takes less than 2 seconds.  Neurological:     Mental Status: He is alert and oriented to person, place, and time.      ED Treatments / Results  Labs (all labs ordered are listed, but only  abnormal results are displayed) Labs Reviewed  BASIC METABOLIC PANEL - Abnormal; Notable for the following components:      Result Value   Creatinine, Ser 1.26 (*)    Anion gap 3 (*)    All other components within normal limits  NOVEL CORONAVIRUS, NAA (HOSPITAL ORDER, SEND-OUT TO REF LAB)  CBC  TROPONIN I  TROPONIN I    EKG EKG Interpretation  Date/Time:  Saturday March 28 2019 15:33:35 EDT Ventricular Rate:  74 PR Interval:    QRS Duration: 76 QT Interval:  376 QTC Calculation: 418 R Axis:   46 Text Interpretation:  Sinus rhythm Probable left atrial enlargement Abnormal T, consider ischemia, diffuse leads new t wave inversions compared with prior 4/09 Confirmed by Meridee ScoreButler, Michael [id number]) on 03/28/2019 3:38:54 PM   Radiology Dg Chest 2 View  Result Date: 03/28/2019 CLINICAL DATA:  Left-sided chest pain. EXAM: CHEST - 2 VIEW COMPARISON:  01/13/2008 FINDINGS: Cardiomediastinal silhouette is normal. Mediastinal contours appear intact. Subtle peribronchial airspace opacities in the left lower lobe. Osseous structures are without acute abnormality. Soft tissues are grossly normal. IMPRESSION: Subtle peribronchial airspace opacities in the left lower lobe may represent atelectasis or atypical/viral pneumonia. Electronically Signed   By: Ted Mcalpineobrinka  Dimitrova M.D.   On: 03/28/2019 16:17    Procedures Procedures (including critical care time)  Medications Ordered in ED Medications - No data to display   Initial Impression / Assessment and Plan / ED Course  I have reviewed the triage vital signs and the nursing notes.  Pertinent labs & imaging results that were available during my care of the patient were reviewed by me and considered in my medical decision making (see chart for details).        Patient presenting for evaluation 1 week history of intermittent chest pain.  Physical exam reassuring, he appears nontoxic.  Pain is not reproducible with the chest wall, however patient  states it is more likely to occur with movement.  As such, consider MSK cause.  Patient does have a history of cocaine use, consider vasospasm as cause.  Upon my initial evaluation the patient, labs, x-ray, and EKG have already been done.  Labs reassuring and that troponin is negative.  Mild elevation in creatinine, though not far from patient's baseline.  X-ray viewed interpreted by me, no obvious pneumonia.  Per radiologist read, concern for atelectasis versus an atypical/viral pneumonia.  Patient without cough, fever, shortness of breath.  Doubt pna.  No sxs consistent with covid.  EKG change from previous, but no STEMI.  doubt PE. Case discussed with attending, Dr. Rubin PayorPickering agrees to plan.  As EKG has been change,  will obtain delta troponin.  As chest x-ray shows possible atypical/viral pneumonia, will perform send out COVID test for completeness.  Delta troponin negative.  Discussed findings with patient.  Discussed treatment with potential for possible inflammatory cause.  Discussed importance of quarantine until coronavirus results have returned.  Encourage follow-up with primary care symptoms not proving.  At this time, patient appears safe for discharge.  Return precautions given.  Patient states he understands and agrees to plan.  Danny English was evaluated in Emergency Department on 03/29/2019 for the symptoms described in the history of present illness. He was evaluated in the context of the global COVID-19 pandemic, which necessitated consideration that the patient might be at risk for infection with the SARS-CoV-2 virus that causes COVID-19. Institutional protocols and algorithms that pertain to the evaluation of patients at risk for COVID-19 are in a state of rapid change based on information released by regulatory bodies including the CDC and federal and state organizations. These policies and algorithms were followed during the patient's care in the ED.    Final Clinical  Impressions(s) / ED Diagnoses   Final diagnoses:  Atypical chest pain    ED Discharge Orders         Ordered    diclofenac sodium (VOLTAREN) 1 % GEL  4 times daily     03/28/19 1933           Alveria ApleyCaccavale, Jacoya Bauman, PA-C 03/29/19 0019    Alveria ApleyCaccavale, Adiel Mcnamara, PA-C 03/29/19 0019    Benjiman CorePickering, Nathan, MD 04/09/19 [phone]

## 2019-04-02 LAB — NOVEL CORONAVIRUS, NAA (HOSP ORDER, SEND-OUT TO REF LAB; TAT 18-24 HRS): SARS-CoV-2, NAA: NOT DETECTED

## 2019-10-19 IMAGING — CT CT L SPINE W/O CM
3 series · 10 of 33 positions shown, 11 images · IV contrast (Omni 300)
Comparison: None.

CLINICAL DATA: Left-sided flank and groin pain.

EXAM:
CT LUMBAR SPINE WITHOUT CONTRAST
TECHNIQUE: Multidetector CT imaging of the lumbar spine was performed without
intravenous contrast administration. Multiplanar CT image
reconstructions were also generated. Supine images were
reconstructed from the CT abdomen and pelvis.

[Series 5: l spine sag · sagittal · 0.28mm/px · 5 of 129 slices shown]
[im 43/129  bone]
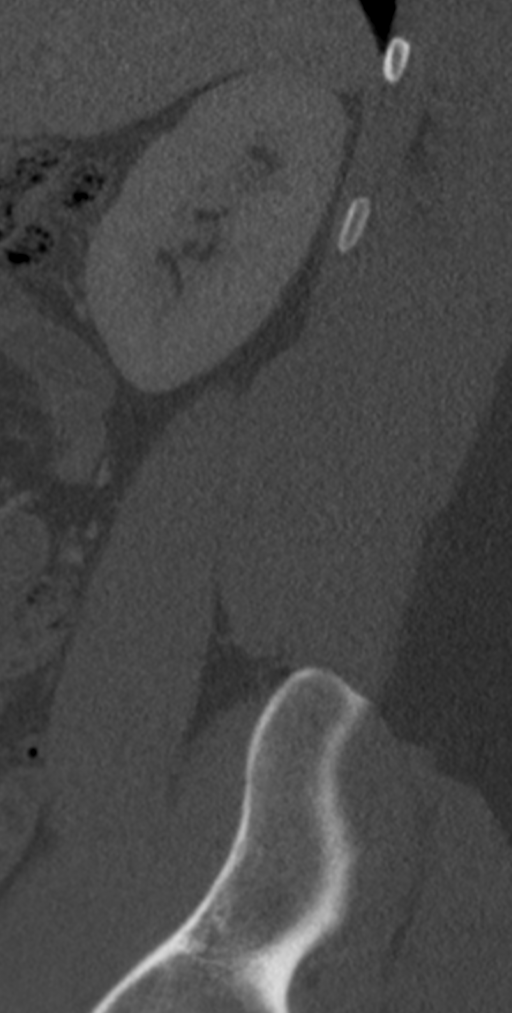
[im 54/129  bone]
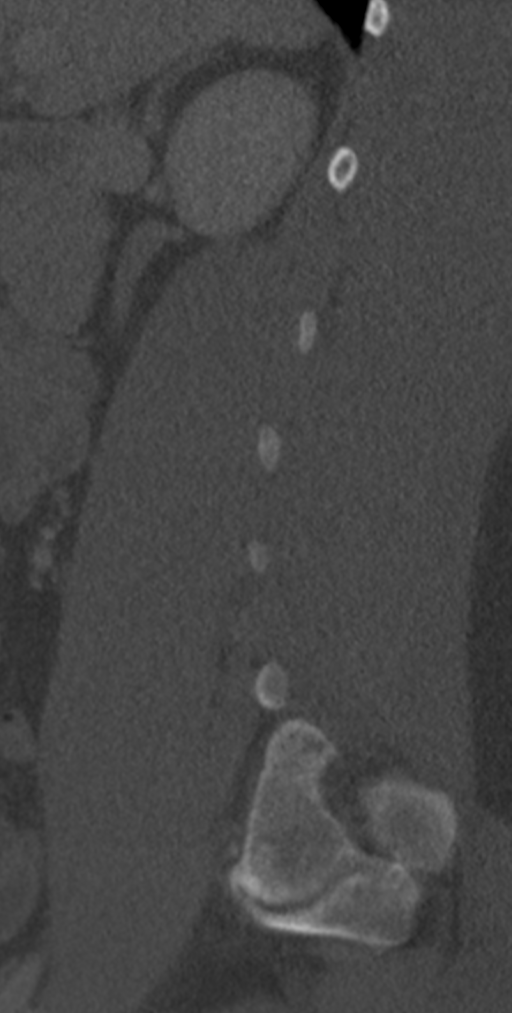
[im 65/129  bone]
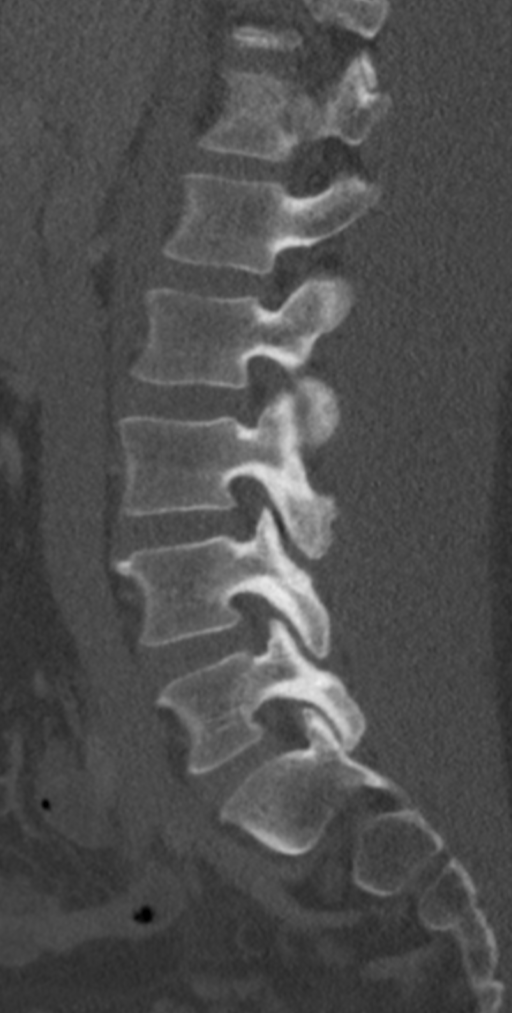
[im 75/129  bone]
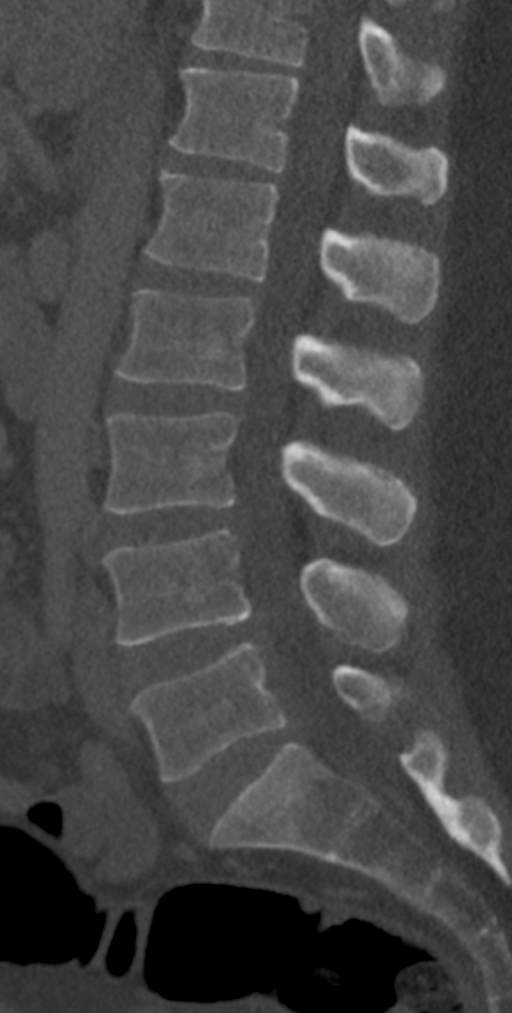
[im 86/129  bone]
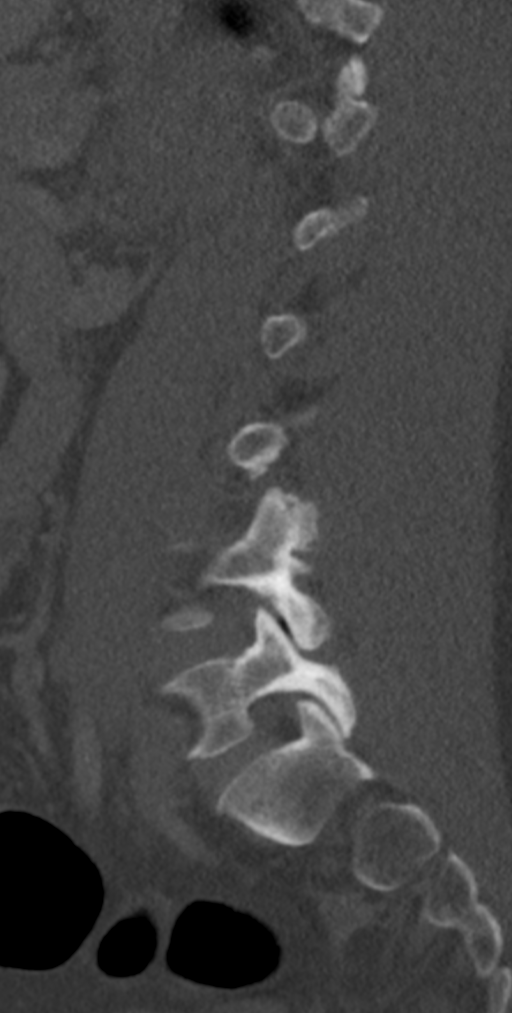

[Series 6: l spine axial 2mm · axial · 0.31mm/px · z∈[+799,+919]mm · 2 of 130 slices shown, 3 images]
[im 40/130  soft-tissue]
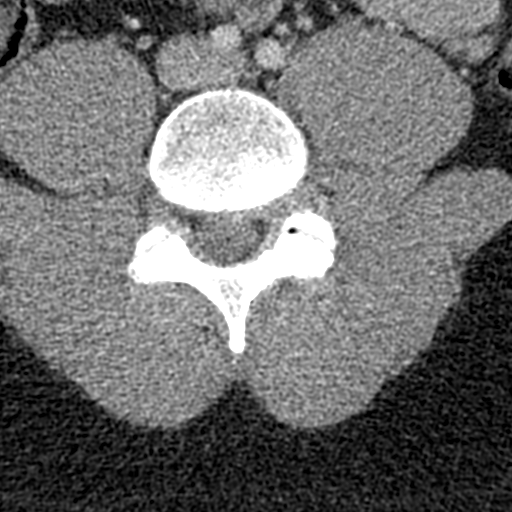
[im 40/130  bone]
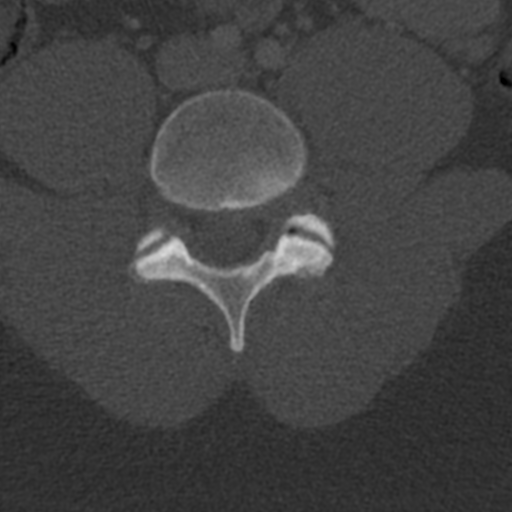
[im 100/130  bone]
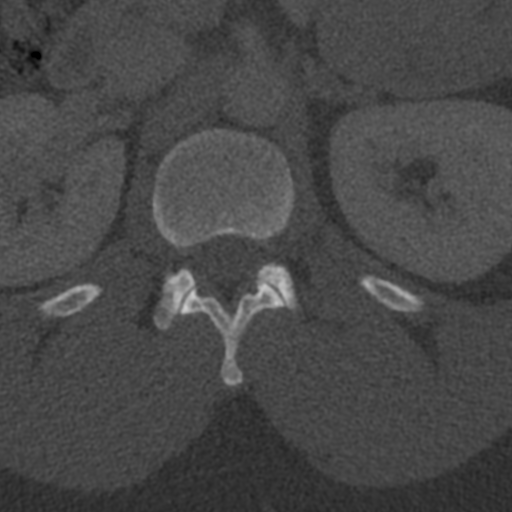

[Series 7: l spine cor st · coronal · 0.26mm/px · 3 of 151 slices shown]
[im 31/151  bone]
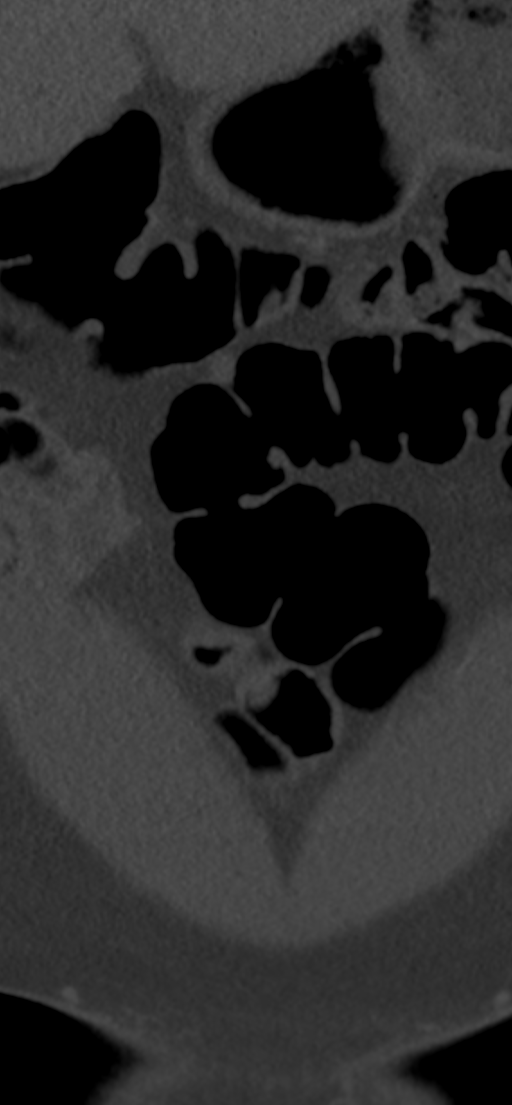
[im 61/151  bone]
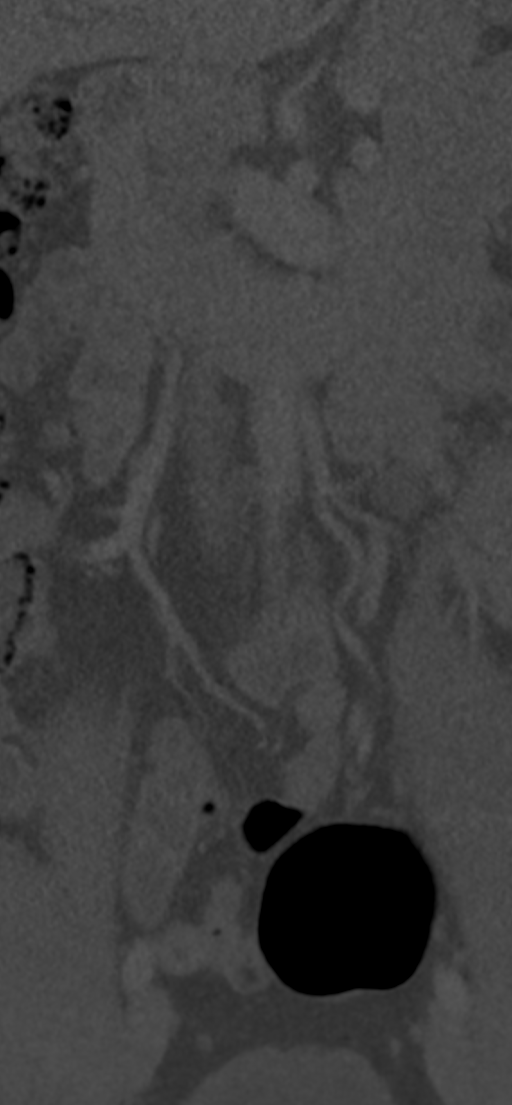
[im 91/151  bone]
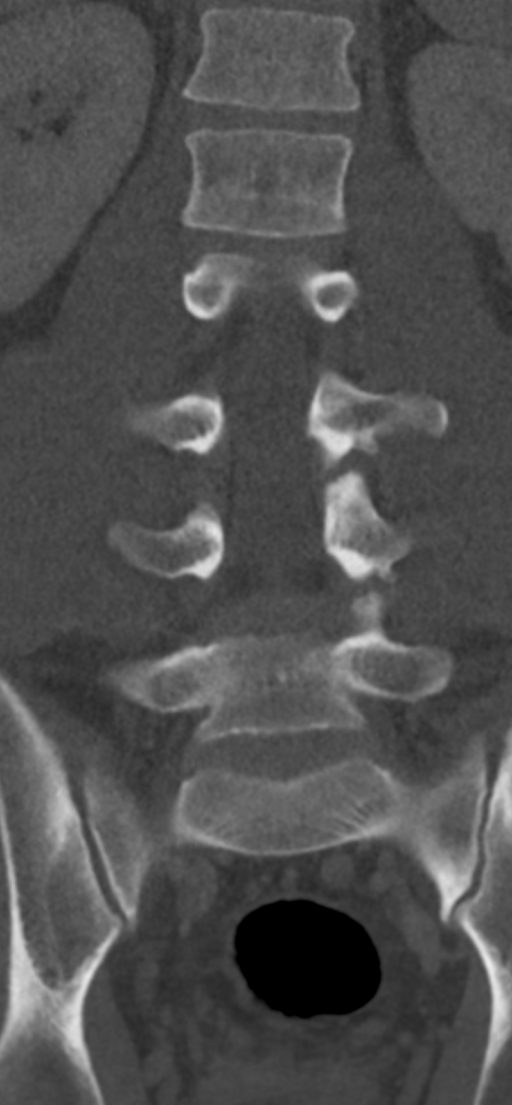

[10 of 33 positions shown; findings below may reference images not displayed]

FINDINGS: Segmentation: 5 non rib-bearing lumbar type vertebral bodies are
present.

Alignment: AP alignment is anatomic. There is mild rightward
curvature of the upper lumbar spine, centered at L2-3.

Vertebrae: Vertebral body heights are maintained. No focal lytic or
blastic lesions are present.

Paraspinal and other soft tissues: Limited imaging the abdomen is
unremarkable. There is no significant adenopathy. Please see full CT
abdomen and pelvis report.

Disc levels: T12-L1: Negative.

L1-2: Negative.

L2-3: Mild disc bulging is present. There is no significant central
stenosis. Mild foraminal narrowing is evident bilaterally.

L3-4: Mild disc bulging is present. Mild facet hypertrophy is noted
bilaterally. The central canal is patent. Foraminal narrowing is
present bilaterally.

L4-5: A leftward disc protrusion is present. Moderate facet
hypertrophy is worse on the left. This results an moderate left
subarticular and severe left foraminal stenosis. There is mild right
subarticular and foraminal stenosis.

L5-S1: Shallow leftward disc bulging is present without significant
stenosis.
IMPRESSION: 1. Leftward disc protrusion at L4-5 with moderate left subarticular
and severe left foraminal stenosis.
2. Mild right subarticular and foraminal narrowing at L4-5.
3. Mild bilateral foraminal narrowing at L2-3 and L3-4 secondary to
disc bulging and facet hypertrophy.
4. Shallow leftward disc bulging at L5-S1 without focal stenosis.

## 2019-11-26 DIAGNOSIS — R030 Elevated blood-pressure reading, without diagnosis of hypertension: Secondary | ICD-10-CM | POA: Diagnosis not present

## 2019-11-26 DIAGNOSIS — R69 Illness, unspecified: Secondary | ICD-10-CM | POA: Diagnosis not present

## 2019-11-26 DIAGNOSIS — Z720 Tobacco use: Secondary | ICD-10-CM | POA: Diagnosis not present

## 2019-11-26 DIAGNOSIS — E669 Obesity, unspecified: Secondary | ICD-10-CM | POA: Diagnosis not present

## 2019-12-20 DIAGNOSIS — Z20822 Contact with and (suspected) exposure to covid-19: Secondary | ICD-10-CM | POA: Diagnosis not present

## 2020-11-04 ENCOUNTER — Encounter (HOSPITAL_COMMUNITY): Payer: Self-pay

## 2020-12-11 DIAGNOSIS — Z791 Long term (current) use of non-steroidal anti-inflammatories (NSAID): Secondary | ICD-10-CM | POA: Diagnosis not present

## 2020-12-11 DIAGNOSIS — Z803 Family history of malignant neoplasm of breast: Secondary | ICD-10-CM | POA: Diagnosis not present

## 2020-12-11 DIAGNOSIS — Z6841 Body Mass Index (BMI) 40.0 and over, adult: Secondary | ICD-10-CM | POA: Diagnosis not present

## 2020-12-11 DIAGNOSIS — Z720 Tobacco use: Secondary | ICD-10-CM | POA: Diagnosis not present

## 2020-12-11 DIAGNOSIS — R030 Elevated blood-pressure reading, without diagnosis of hypertension: Secondary | ICD-10-CM | POA: Diagnosis not present

## 2021-11-01 ENCOUNTER — Ambulatory Visit
Admission: EM | Admit: 2021-11-01 | Discharge: 2021-11-01 | Disposition: A | Discharge status: Home / Self Care | Payer: Medicare (Managed Care) | Attending: Physician Assistant | Admitting: Physician Assistant

## 2021-11-01 ENCOUNTER — Other Ambulatory Visit: Payer: Self-pay

## 2021-11-01 DIAGNOSIS — J029 Acute pharyngitis, unspecified: Secondary | ICD-10-CM | POA: Diagnosis present

## 2021-11-01 LAB — POCT RAPID STREP A (OFFICE): Rapid Strep A Screen: NEGATIVE

## 2021-11-01 NOTE — ED Provider Notes (Signed)
[facility]    CSN: [id number] Arrival date & time: 11/01/21  1451      History   Chief Complaint Chief Complaint  Patient presents with   Sore Throat    HPI Danny English is a 46 y.o. male.   Patient here today for evaluation of sore throat that started yesterday.  He reports that he has had some coughing and congestion but this has been very mild.  He reports pain is intermittent with swallowing.  He has tried throat spray and tea without significant relief.  He has not had any nausea or vomiting and denies diarrhea.  He has not had fever.  The history is provided by the patient.  Sore Throat Pertinent negatives include no abdominal pain and no shortness of breath.   History reviewed. No pertinent past medical history.  Patient Active Problem List   Diagnosis Date Noted   Left inguinal pain 01/21/2018   Tobacco dependence 01/21/2018    History reviewed. No pertinent surgical history.     Home Medications    Prior to Admission medications   Medication Sig Start Date End Date Taking? Authorizing Provider  diclofenac sodium (VOLTAREN) 1 % GEL Apply 2 g topically 4 (four) times daily. 03/28/19   Caccavale, Sophia, PA-C    Family History Family History  Problem Relation Age of Onset   Cancer Mother    Breast cancer Mother     Social History Social History   Tobacco Use   Smoking status: Every Day    Types: Cigarettes   Smokeless tobacco: Never  Substance Use Topics   Alcohol use: Yes   Drug use: Yes    Types: Marijuana     Allergies   Patient has no known allergies.   Review of Systems Review of Systems  Constitutional:  Positive for chills. Negative for fever.  HENT:  Positive for congestion and sore throat. Negative for ear pain.   Eyes:  Negative for discharge and redness.  Respiratory:  Positive for cough. Negative for shortness of breath.   Gastrointestinal:  Negative for abdominal pain, nausea and vomiting.     Physical Exam Triage Vital Signs ED Triage Vitals  Enc Vitals Group     BP 11/01/21 1527 (!) 176/109     Pulse Rate 11/01/21 1527 82     Resp 11/01/21 1527 18     Temp 11/01/21 1527 98.7 F (37.1 C)     Temp Source 11/01/21 1527 Oral     SpO2 11/01/21 1527 97 %     Weight --      Height --      Head Circumference --      Peak Flow --      Pain Score 11/01/21 1530 7     Pain Loc --      Pain Edu? --      Excl. in GC? --    No data found.  Updated Vital Signs BP (!) 176/109 (BP Location: Left Arm)    Pulse 82    Temp 98.7 F (37.1 C) (Oral)    Resp 18    SpO2 97%      Physical Exam Vitals and nursing note reviewed.  Constitutional:      General: He is not in acute distress.    Appearance: Normal appearance. He is not ill-appearing.  HENT:     Head: Normocephalic and atraumatic.     Nose: Nose normal. No congestion.     Mouth/Throat:  Mouth: Mucous membranes are moist.     Pharynx: Oropharynx is clear. No oropharyngeal exudate or posterior oropharyngeal erythema.  Eyes:     Conjunctiva/sclera: Conjunctivae normal.  Cardiovascular:     Rate and Rhythm: Normal rate and regular rhythm.     Heart sounds: Normal heart sounds. No murmur heard. Pulmonary:     Effort: Pulmonary effort is normal. No respiratory distress.     Breath sounds: Normal breath sounds. No wheezing, rhonchi or rales.  Skin:    General: Skin is warm and dry.  Neurological:     Mental Status: He is alert.  Psychiatric:        Mood and Affect: Mood normal.        Thought Content: Thought content normal.     UC Treatments / Results  Labs (all labs ordered are listed, but only abnormal results are displayed) Labs Reviewed  CULTURE, GROUP A STREP [facility])  POCT RAPID STREP A (OFFICE)    EKG   Radiology No results found.  Procedures Procedures (including critical care time)  Medications Ordered in UC Medications - No data to display  Initial Impression / Assessment and Plan / UC  Course  I have reviewed the triage vital signs and the nursing notes.  Pertinent labs & imaging results that were available during my care of the patient were reviewed by me and considered in my medical decision making (see chart for details).    Strep test negative.  Recommended symptomatic treatment.  Will order throat culture.  Encouraged follow-up if symptoms fail to improve or worsen.  Final Clinical Impressions(s) / UC Diagnoses   Final diagnoses:  Acute pharyngitis, unspecified etiology     Discharge Instructions       Try Cepacol lozenges for sore throat. Continue symptomatic treatment. Follow up with PCP regarding elevated blood pressure.        ED Prescriptions   None    PDMP not reviewed this encounter.   Francene Finders, PA-C 11/01/21 1614

## 2021-11-01 NOTE — Discharge Instructions (Signed)
°  Try Cepacol lozenges for sore throat. Continue symptomatic treatment. Follow up with PCP regarding elevated blood pressure.

## 2021-11-01 NOTE — ED Triage Notes (Signed)
Onset yesterday of sore throat with minimal coughing and congestion. Confirms intermittent pain with swallowing. Has been using throat spray and drinking tea without relief. Also took advil with temporary relief. No v/d. Afebrile.

## 2021-11-05 LAB — CULTURE, GROUP A STREP (THRC)

## 2021-12-01 ENCOUNTER — Ambulatory Visit
Admission: EM | Admit: 2021-12-01 | Discharge: 2021-12-01 | Disposition: A | Discharge status: Home / Self Care | Payer: Medicare (Managed Care) | Attending: Internal Medicine | Admitting: Internal Medicine

## 2021-12-01 ENCOUNTER — Other Ambulatory Visit: Payer: Self-pay

## 2021-12-01 DIAGNOSIS — L03116 Cellulitis of left lower limb: Secondary | ICD-10-CM | POA: Diagnosis not present

## 2021-12-01 MED ORDER — DOXYCYCLINE HYCLATE 100 MG PO CAPS: 100.0000 mg | ORAL_CAPSULE | Freq: Two times a day (BID) | ORAL | 0 refills | Status: AC

## 2021-12-01 NOTE — ED Triage Notes (Signed)
2 days ago, Pt noticed a non pruritic, red, swollen area on his left lower leg. Area is tender to the touch. Has been applying alcohol to the area. Pt suspects spider bite but is uncertain.

## 2021-12-01 NOTE — ED Provider Notes (Signed)
[facility]    CSN: [id number] Arrival date & time: 12/01/21  1139      History   Chief Complaint Chief Complaint  Patient presents with   Insect Bite    possible    HPI Danny English is a 46 y.o. male.   Patient presents with a swollen and erythematous area to the left lower leg that he noticed approximately 2 days ago.  Denies any apparent injury to the area.  He is suspicious of spider bite but denies any known spider bite or insect bite.  Area is not itchy.  He denies fevers or chills but does report some body aches that occurred prior to noticing area to left leg.  He reports the body aches have now resolved.  Denies nausea, vomiting, dizziness, headache.  Has been applying alcohol to the area.  Denies history of MRSA.    History reviewed. No pertinent past medical history.  Patient Active Problem List   Diagnosis Date Noted   Left inguinal pain 01/21/2018   Tobacco dependence 01/21/2018    History reviewed. No pertinent surgical history.     Home Medications    Prior to Admission medications   Medication Sig Start Date End Date Taking? Authorizing Provider  doxycycline (VIBRAMYCIN) 100 MG capsule Take 1 capsule (100 mg total) by mouth 2 (two) times daily. 12/01/21  Yes , Rolly Salter E, FNP  diclofenac sodium (VOLTAREN) 1 % GEL Apply 2 g topically 4 (four) times daily. 03/28/19   Caccavale, Sophia, PA-C    Family History Family History  Problem Relation Age of Onset   Cancer Mother    Breast cancer Mother     Social History Social History   Tobacco Use   Smoking status: Every Day    Types: Cigarettes   Smokeless tobacco: Never  Substance Use Topics   Alcohol use: Yes   Drug use: Yes    Types: Marijuana     Allergies   Patient has no known allergies.   Review of Systems Review of Systems Per HPI  Physical Exam Triage Vital Signs ED Triage Vitals  Enc Vitals Group     BP 12/01/21 1150 140/81     Pulse Rate 12/01/21  1150 95     Resp 12/01/21 1150 18     Temp 12/01/21 1150 99.3 F (37.4 C)     Temp Source 12/01/21 1150 Oral     SpO2 12/01/21 1150 97 %     Weight --      Height --      Head Circumference --      Peak Flow --      Pain Score 12/01/21 1153 7     Pain Loc --      Pain Edu? --      Excl. in GC? --    No data found.  Updated Vital Signs BP 140/81 (BP Location: Left Arm)    Pulse 95    Temp 99.3 F (37.4 C) (Oral)    Resp 18    SpO2 97%   Visual Acuity Right Eye Distance:   Left Eye Distance:   Bilateral Distance:    Right Eye Near:   Left Eye Near:    Bilateral Near:     Physical Exam Constitutional:      General: He is not in acute distress.    Appearance: Normal appearance. He is not toxic-appearing or diaphoretic.  HENT:     Head: Normocephalic and atraumatic.  Eyes:  Extraocular Movements: Extraocular movements intact.     Conjunctiva/sclera: Conjunctivae normal.  Cardiovascular:     Rate and Rhythm: Normal rate and regular rhythm.     Pulses: Normal pulses.     Heart sounds: Normal heart sounds.  Pulmonary:     Effort: Pulmonary effort is normal. No respiratory distress.     Breath sounds: Normal breath sounds.  Skin:         Comments: Erythema with mild nonpitting edema present to left lower shin.  No obvious abscess or drainage noted.  Skin is intact.  Neurovascular intact.  Patient has full range of motion of left lower extremity.    Neurological:     General: No focal deficit present.     Mental Status: He is alert and oriented to person, place, and time. Mental status is at baseline.  Psychiatric:        Mood and Affect: Mood normal.        Behavior: Behavior normal.        Thought Content: Thought content normal.        Judgment: Judgment normal.     UC Treatments / Results  Labs (all labs ordered are listed, but only abnormal results are displayed) Labs Reviewed  CK  BASIC METABOLIC PANEL  CBC    EKG   Radiology No results  found.  Procedures Procedures (including critical care time)  Medications Ordered in UC Medications - No data to display  Initial Impression / Assessment and Plan / UC Course  I have reviewed the triage vital signs and the nursing notes.  Pertinent labs & imaging results that were available during my care of the patient were reviewed by me and considered in my medical decision making (see chart for details).     Differential diagnoses include cellulitis of left lower leg versus spider bite.  Will treat with doxycycline antibiotic.  CMP, CBC, CK pending due to patient's report of body aches and low-grade fever on exam.Discussed supportive care and symptom management with patient.  Patient to monitor very closely and to follow-up at hospital if symptoms persist or worsen.  Patient verbalized understanding and was agreeable with plan. Final Clinical Impressions(s) / UC Diagnoses   Final diagnoses:  Cellulitis of leg, left     Discharge Instructions      You have been prescribed an antibiotic for possible infection to left lower leg.  Please also use warm compresses.  Follow-up with primary care doctor or go to the hospital if symptoms persist or worsen.    ED Prescriptions     Medication Sig Dispense Auth. Provider   doxycycline (VIBRAMYCIN) 100 MG capsule Take 1 capsule (100 mg total) by mouth 2 (two) times daily. 20 capsule Gustavus Bryant, [place]      PDMP not reviewed this encounter.   Gustavus Bryant, [place] 12/01/21 1220

## 2021-12-01 NOTE — Discharge Instructions (Addendum)
You have been prescribed an antibiotic for possible infection to left lower leg.  Please also use warm compresses.  Follow-up with primary care doctor or go to the hospital if symptoms persist or worsen.

## 2021-12-02 ENCOUNTER — Emergency Department (HOSPITAL_COMMUNITY)
Admission: EM | Admit: 2021-12-02 | Discharge: 2021-12-02 | Disposition: A | Discharge status: Home / Self Care | Payer: Medicare (Managed Care) | Attending: Emergency Medicine | Admitting: Emergency Medicine

## 2021-12-02 ENCOUNTER — Encounter (HOSPITAL_COMMUNITY): Payer: Self-pay | Admitting: *Deleted

## 2021-12-02 ENCOUNTER — Telehealth (HOSPITAL_COMMUNITY): Payer: Self-pay | Admitting: Emergency Medicine

## 2021-12-02 ENCOUNTER — Other Ambulatory Visit: Payer: Self-pay

## 2021-12-02 DIAGNOSIS — L03116 Cellulitis of left lower limb: Secondary | ICD-10-CM | POA: Insufficient documentation

## 2021-12-02 DIAGNOSIS — R944 Abnormal results of kidney function studies: Secondary | ICD-10-CM | POA: Insufficient documentation

## 2021-12-02 DIAGNOSIS — R748 Abnormal levels of other serum enzymes: Secondary | ICD-10-CM | POA: Diagnosis not present

## 2021-12-02 DIAGNOSIS — D72829 Elevated white blood cell count, unspecified: Secondary | ICD-10-CM | POA: Insufficient documentation

## 2021-12-02 DIAGNOSIS — Z79899 Other long term (current) drug therapy: Secondary | ICD-10-CM | POA: Diagnosis not present

## 2021-12-02 LAB — BASIC METABOLIC PANEL
Anion gap: 7 (ref 5–15)
BUN/Creatinine Ratio: 8 — ABNORMAL LOW (ref 9–20)
BUN: 10 mg/dL (ref 6–24)
BUN: 18 mg/dL (ref 6–20)
CO2: 22 mmol/L (ref 20–29)
CO2: 27 mmol/L (ref 22–32)
Calcium: 9.1 mg/dL (ref 8.7–10.2)
Calcium: 9.2 mg/dL (ref 8.9–10.3)
Chloride: 104 mmol/L (ref 96–106)
Chloride: 103 mmol/L (ref 98–111)
Creatinine, Ser: 1.3 mg/dL — ABNORMAL HIGH (ref 0.76–1.27)
Creatinine, Ser: 1.49 mg/dL — ABNORMAL HIGH (ref 0.61–1.24)
GFR, Estimated: 58 mL/min — ABNORMAL LOW (ref >60)
Glucose, Bld: 111 mg/dL — ABNORMAL HIGH (ref 70–99)
Glucose: 120 mg/dL — ABNORMAL HIGH (ref 70–99)
Potassium: 3.9 mmol/L (ref 3.5–5.2)
Potassium: 3.4 mmol/L — ABNORMAL LOW (ref 3.5–5.1)
Sodium: 144 mmol/L (ref 134–144)
Sodium: 137 mmol/L (ref 135–145)
eGFR: 69 mL/min/{1.73_m2} (ref >59)

## 2021-12-02 LAB — CBC
Hematocrit: 43.1 % (ref 37.5–51.0)
Hemoglobin: 14.2 g/dL (ref 13.0–17.7)
MCH: 28.6 pg (ref 26.6–33.0)
MCHC: 32.9 g/dL (ref 31.5–35.7)
MCV: 87 fL (ref 79–97)
Platelets: 241 10*3/uL (ref 150–450)
RBC: 4.97 x10E6/uL (ref 4.14–5.80)
RDW: 14.1 % (ref 11.6–15.4)
WBC: 11.1 10*3/uL — ABNORMAL HIGH (ref 3.4–10.8)

## 2021-12-02 LAB — CBC WITH DIFFERENTIAL/PLATELET
Abs Immature Granulocytes: 0.03 10*3/uL (ref 0.00–0.07)
Basophils Absolute: 0.1 10*3/uL (ref 0.0–0.1)
Basophils Relative: 0 %
Eosinophils Absolute: 0.7 10*3/uL — ABNORMAL HIGH (ref 0.0–0.5)
Eosinophils Relative: 6 %
HCT: 44.1 % (ref 39.0–52.0)
Hemoglobin: 14.2 g/dL (ref 13.0–17.0)
Immature Granulocytes: 0 %
Lymphocytes Relative: 19 %
Lymphs Abs: 2.2 10*3/uL (ref 0.7–4.0)
MCH: 28.5 pg (ref 26.0–34.0)
MCHC: 32.2 g/dL (ref 30.0–36.0)
MCV: 88.6 fL (ref 80.0–100.0)
Monocytes Absolute: 1.2 10*3/uL — ABNORMAL HIGH (ref 0.1–1.0)
Monocytes Relative: 11 %
Neutro Abs: 7.4 10*3/uL (ref 1.7–7.7)
Neutrophils Relative %: 64 %
Platelets: 270 10*3/uL (ref 150–400)
RBC: 4.98 MIL/uL (ref 4.22–5.81)
RDW: 14.6 % (ref 11.5–15.5)
WBC: 11.6 10*3/uL — ABNORMAL HIGH (ref 4.0–10.5)
nRBC: 0 % (ref 0.0–0.2)

## 2021-12-02 LAB — CK: Total CK: 520 U/L (ref 49–439)

## 2021-12-02 NOTE — Telephone Encounter (Signed)
Patient is being called post visit for result review from the Urgent Care and sent to the Emergency Department via private vehicle . Per Rolly Salter, APP, patient is in need of higher level of care due to elevated CK with possible spider bite. Patient is aware and verbalizes understanding of plan of care.

## 2021-12-02 NOTE — ED Triage Notes (Signed)
Pt says he was called by UC to come to ED for further evaluation, one of his labs was abnormal. Seen at [facility] yesterday for possible insect bite, given abx, says the area is a little more red and spreading.

## 2021-12-02 NOTE — ED Provider Notes (Signed)
[facility] DEPT Provider Note   CSN: [id number] Arrival date & time: 12/02/21  1901     History  Chief Complaint  Patient presents with   Insect Bite    Danny English is a 46 y.o. male.  HPI Patient presents with concern of abnormal lab after initially being seen and evaluated yesterday for left leg lesion, diagnosed with cellulitis.  States that he is generally well aside from challenges with weight. 3 days ago he noticed a lesion on the left lateral leg.  It is painful, to the touch and at rest, yesterday was seen, evaluated, started doxycycline, x2.  He notes that since initiating medicine the wound is less painful.  Today he was informed from urgent care that his labs were abnormal and sent here for evaluation.  He states that he feels generally substantially better, has no interval fever, no loss of sensation or weakness distally.    Home Medications Prior to Admission medications   Medication Sig Start Date End Date Taking? Authorizing Provider  diclofenac sodium (VOLTAREN) 1 % GEL Apply 2 g topically 4 (four) times daily. 03/28/19   Caccavale, Sophia, PA-C  doxycycline (VIBRAMYCIN) 100 MG capsule Take 1 capsule (100 mg total) by mouth 2 (two) times daily. 12/01/21   Teodora Medici, FNP      Allergies    Patient has no known allergies.    Review of Systems   Review of Systems  Constitutional:        Per HPI, otherwise negative  HENT:         Per HPI, otherwise negative  Respiratory:         Per HPI, otherwise negative  Cardiovascular:        Per HPI, otherwise negative  Gastrointestinal:  Negative for vomiting.  Endocrine:       Negative aside from HPI  Genitourinary:        Neg aside from HPI   Musculoskeletal:        Per HPI, otherwise negative  Skin:  Positive for color change and wound.  Neurological:  Negative for syncope.   Physical Exam Updated Vital Signs BP (!) 158/96 (BP Location: Right Arm)    Pulse 89     Temp 100 F (37.8 C) (Oral)    Resp 16    SpO2 98%  Physical Exam Vitals and nursing note reviewed.  Constitutional:      General: He is not in acute distress.    Appearance: He is well-developed.  HENT:     Head: Normocephalic and atraumatic.  Eyes:     Conjunctiva/sclera: Conjunctivae normal.  Cardiovascular:     Rate and Rhythm: Normal rate and regular rhythm.  Pulmonary:     Effort: Pulmonary effort is normal. No respiratory distress.     Breath sounds: No stridor.  Abdominal:     General: There is no distension.  Skin:    General: Skin is warm and dry.       Neurological:     Mental Status: He is alert and oriented to person, place, and time.    ED Results / Procedures / Treatments     (all labs ordered are listed, but only abnormal results are displayed) Labs Reviewed  BASIC METABOLIC PANEL - Abnormal; Notable for the following components:      Result Value   Potassium 3.4 (*)    Glucose, Bld 111 (*)    Creatinine, Ser 1.49 (*)    GFR, Estimated  58 (*)    All other components within normal limits  CBC WITH DIFFERENTIAL/PLATELET - Abnormal; Notable for the following components:   WBC 11.6 (*)    Monocytes Absolute 1.2 (*)    Eosinophils Absolute 0.7 (*)    All other components within normal limits    EKG None  Radiology No results found.  Procedures Procedures    Medications Ordered in ED Medications - No data to display  ED Course/ Medical Decision Making/ A&P  This patient presents to the ED for concern of leg wound, abnormal lab, after being diagnosed with cellulitis yesterday, this involves an extensive number of treatment options, and is a complaint that carries with it a high risk of complications and morbidity.  The differential diagnosis includes rhabdo, compartment syndrome, bacteremia, sepsis.   Co morbidities that complicate the patient evaluation  None   Social Determinants of Health:  None   Additional history  obtained:  Additional history and/or information obtained from urgent care note, including lab review with positive leukocytosis, and a CK of 500 performed yesterday External records from outside source obtained and reviewed including as above   After the initial evaluation, orders, including: Labs were initiated.  The patient was also maintained on pulse oximetry. The readings were typically 99% room air  On repeat evaluation of the patient improved  Lab Tests:  I personally interpreted labs.  The pertinent results include: Mild leukocytosis, slight elevation in creatinine, both consistent with prior.  Patient is afebrile, awake, alert, has no evidence for rhabdomyolysis, compartment syndrome, distal neurovascular compromise.  Elevated CKs consistent with known infection, patient and I had a lengthy conversation about the importance of staying well-hydrated.    Dispostion / Final MDM:  After consideration of the diagnostic results and the patient's response to treatment, he is appropriate for discharge, as above, with conversation regarding need for fluid resuscitation at home.  No evidence for bacteremia, sepsis, no evidence for distal neurovascular compromise, no hemodynamic instability..  Final Clinical Impression(s) / ED Diagnoses Final diagnoses:  Cellulitis of left lower extremity     Carmin Muskrat, MD 12/02/21 2051

## 2021-12-02 NOTE — Discharge Instructions (Signed)
As discussed, your evaluation today has been largely reassuring.  But, it is important that you monitor your condition carefully, and do not hesitate to return to the ED if you develop new, or concerning changes in your condition.  Your labs from urgent care, and tonight both suggest a fluid deficit, or dehydration, which is likely slowing the recovery from your cellulitis/infection.  Please be sure to stay well-hydrated.

## 2022-08-12 ENCOUNTER — Emergency Department (HOSPITAL_COMMUNITY)
Admission: EM | Admit: 2022-08-12 | Discharge: 2022-08-12 | Discharge status: Against Medical Advice | Payer: Medicare (Managed Care) | Attending: Physician Assistant | Admitting: Physician Assistant

## 2022-08-12 ENCOUNTER — Encounter (HOSPITAL_COMMUNITY): Payer: Self-pay

## 2022-08-12 ENCOUNTER — Emergency Department (HOSPITAL_COMMUNITY): Payer: Medicare (Managed Care)

## 2022-08-12 DIAGNOSIS — Z5321 Procedure and treatment not carried out due to patient leaving prior to being seen by health care provider: Secondary | ICD-10-CM | POA: Diagnosis not present

## 2022-08-12 DIAGNOSIS — M545 Low back pain, unspecified: Secondary | ICD-10-CM | POA: Diagnosis not present

## 2022-08-12 DIAGNOSIS — R059 Cough, unspecified: Secondary | ICD-10-CM | POA: Insufficient documentation

## 2022-08-12 DIAGNOSIS — Z20822 Contact with and (suspected) exposure to covid-19: Secondary | ICD-10-CM | POA: Insufficient documentation

## 2022-08-12 LAB — SARS CORONAVIRUS 2 BY RT PCR: SARS Coronavirus 2 by RT PCR: NEGATIVE

## 2022-08-12 NOTE — ED Triage Notes (Signed)
Pt arrived via POV, c/o productive cough, yellow phlegm and lower back pain.

## 2022-08-12 NOTE — ED Provider Triage Note (Signed)
Emergency Medicine Provider Triage Evaluation Note  DELFORD WINGERT , a 46 y.o. male  was evaluated in triage.  Pt complains of coughing up phlegm and low back pain  Review of Systems  Positive: Cough,  Negative: fever  Physical Exam  BP (!) 161/97 (BP Location: Left Arm)   Pulse 92   Temp 100 F (37.8 C) (Oral)   Resp 16   Ht 5\' 5"  (1.651 m)   Wt 110.2 kg   SpO2 95%   BMI 40.44 kg/m  Gen:   Awake, no distress   Resp:  Normal effort  MSK:   Moves extremities without difficulty  Other:    Medical Decision Making  Medically screening exam initiated at 1:09 PM.  Appropriate orders placed.  JACKSYN BEEKS was informed that the remainder of the evaluation will be completed by another provider, this initial triage assessment does not replace that evaluation, and the importance of remaining in the ED until their evaluation is complete.     Fransico Meadow, [place] 08/12/22 1310

## 2022-08-12 NOTE — ED Notes (Signed)
Pt come to desk and stated he is going to go on and leave. States she told me to check my chart. Pt aware to return at anytime if he does not improve.

## 2022-09-21 ENCOUNTER — Ambulatory Visit
Admission: EM | Admit: 2022-09-21 | Discharge: 2022-09-21 | Disposition: A | Discharge status: Home / Self Care | Payer: Medicare (Managed Care) | Attending: Physician Assistant | Admitting: Physician Assistant

## 2022-09-21 ENCOUNTER — Encounter: Payer: Self-pay | Admitting: Emergency Medicine

## 2022-09-21 DIAGNOSIS — S76211A Strain of adductor muscle, fascia and tendon of right thigh, initial encounter: Secondary | ICD-10-CM | POA: Diagnosis not present

## 2022-09-21 DIAGNOSIS — R1031 Right lower quadrant pain: Secondary | ICD-10-CM | POA: Diagnosis not present

## 2022-09-21 MED ORDER — KETOROLAC TROMETHAMINE 30 MG/ML IJ SOLN
30.0000 mg | Freq: Once | INTRAMUSCULAR | Status: AC
  Administered 2022-09-21: 30 mg via INTRAMUSCULAR

## 2022-09-21 MED ORDER — IBUPROFEN 800 MG PO TABS: 800.0000 mg | ORAL_TABLET | Freq: Three times a day (TID) | ORAL | 0 refills | Status: AC

## 2022-09-21 NOTE — ED Provider Notes (Signed)
[facility]    CSN: [id number] Arrival date & time: 09/21/22  0843      History   Chief Complaint Chief Complaint  Patient presents with   Groin Pain    HPI Danny English is a 46 y.o. male.   46 year old male presents with groin pain.  Patient indicates for the past 2 days he has been having lower back pain with radiation into the right groin.  Patient indicates that the pain is moderately severe he rates it as a 10 on a scale of 1-10.  Patient indicates that the pain is worse in the right groin and tends to increase when he stands, moves, or bends.  Patient indicates that he noticed the pain worse after he engaged in intercourse.  Patient denies any trauma to the back of the groin.  Patient indicates he is not having any problems with urination.  He denies having any frequency, urgency, or dysuria.  Patient denies having any hematuria.  Patient indicates he is not having any pain in the testicle but most of his pain is in the groin area and the lower back.  Patient denies having any fever or chills.  Patient has not taken any medicine to help reduce the pain and discomfort.   Groin Pain    History reviewed. No pertinent past medical history.  Patient Active Problem List   Diagnosis Date Noted   Left inguinal pain 01/21/2018   Tobacco dependence 01/21/2018    History reviewed. No pertinent surgical history.     Home Medications    Prior to Admission medications   Medication Sig Start Date End Date Taking? Authorizing Provider  ibuprofen (ADVIL) 800 MG tablet Take 1 tablet (800 mg total) by mouth 3 (three) times daily. 09/21/22  Yes Ellsworth Lennox, PA-C  diclofenac sodium (VOLTAREN) 1 % GEL Apply 2 g topically 4 (four) times daily. 03/28/19   Caccavale, Sophia, PA-C  doxycycline (VIBRAMYCIN) 100 MG capsule Take 1 capsule (100 mg total) by mouth 2 (two) times daily. 12/01/21   Gustavus Bryant, FNP    Family History Family History  Problem Relation  Age of Onset   Cancer Mother    Breast cancer Mother     Social History Social History   Tobacco Use   Smoking status: Every Day    Types: Cigarettes   Smokeless tobacco: Never  Substance Use Topics   Alcohol use: Yes   Drug use: Yes    Types: Marijuana     Allergies   Patient has no known allergies.   Review of Systems Review of Systems  Musculoskeletal:  Positive for gait problem (right groin pain).     Physical Exam Triage Vital Signs ED Triage Vitals [09/21/22 0923]  Enc Vitals Group     BP (!) 155/101     Pulse Rate 79     Resp 18     Temp 97.8 F (36.6 C)     Temp src      SpO2 95 %     Weight      Height      Head Circumference      Peak Flow      Pain Score 10     Pain Loc      Pain Edu?      Excl. in GC?    No data found.  Updated Vital Signs BP (!) 155/101   Pulse 79   Temp 97.8 F (36.6 C)   Resp 18  SpO2 95%   Visual Acuity Right Eye Distance:   Left Eye Distance:   Bilateral Distance:    Right Eye Near:   Left Eye Near:    Bilateral Near:     Physical Exam   UC Treatments / Results  Labs (all labs ordered are listed, but only abnormal results are displayed) Labs Reviewed - No data to display  EKG   Radiology No results found.  Procedures Procedures (including critical care time)  Medications Ordered in UC Medications  ketorolac (TORADOL) 30 MG/ML injection 30 mg (30 mg Intramuscular Given 09/21/22 1002)    Initial Impression / Assessment and Plan / UC Course  I have reviewed the triage vital signs and the nursing notes.  Pertinent labs & imaging results that were available during my care of the patient were reviewed by me and considered in my medical decision making (see chart for details).    Plan: 1.  The right groin pain will be treated with the following: A.  Toradol 30 mg IM given today in the office. 2.  The right groin strain will be treated with the following: A.  Ibuprofen 800 mg every 8 hours  with food to help reduce pain and discomfort. 3.  Patient advised to follow-up with PCP or return to urgent care if symptoms fail to improve. Final Clinical Impressions(s) / UC Diagnoses   Final diagnoses:  Inguinal strain, right, initial encounter  Right inguinal pain     Discharge Instructions      Advised take ibuprofen 800 mg every 8 hours on a regular basis until condition improves. Advised to follow-up with PCP or return to urgent care if symptoms fail to improve.    ED Prescriptions     Medication Sig Dispense Auth. Provider   ibuprofen (ADVIL) 800 MG tablet Take 1 tablet (800 mg total) by mouth 3 (three) times daily. 21 tablet Ellsworth Lennox, PA-C      PDMP not reviewed this encounter.   Ellsworth Lennox, PA-C 09/21/22 1040

## 2022-09-21 NOTE — ED Triage Notes (Signed)
Pt is present today with right side groin pain.   Onset~2 days ago

## 2022-09-21 NOTE — Discharge Instructions (Addendum)
Advised take ibuprofen 800 mg every 8 hours on a regular basis until condition improves. Advised to follow-up with PCP or return to urgent care if symptoms fail to improve.

## 2023-03-19 LAB — EXTERNAL GENERIC LAB PROCEDURE: COLOGUARD: POSITIVE — AB

## 2023-07-11 ENCOUNTER — Emergency Department (HOSPITAL_COMMUNITY)
Admission: EM | Admit: 2023-07-11 | Discharge: 2023-07-11 | Disposition: A | Discharge status: Home / Self Care | Payer: Medicare (Managed Care) | Attending: Emergency Medicine | Admitting: Emergency Medicine

## 2023-07-11 ENCOUNTER — Other Ambulatory Visit: Payer: Self-pay

## 2023-07-11 ENCOUNTER — Encounter (HOSPITAL_COMMUNITY): Payer: Self-pay | Admitting: Emergency Medicine

## 2023-07-11 DIAGNOSIS — M7989 Other specified soft tissue disorders: Secondary | ICD-10-CM | POA: Diagnosis present

## 2023-07-11 DIAGNOSIS — L03116 Cellulitis of left lower limb: Secondary | ICD-10-CM | POA: Diagnosis not present

## 2023-07-11 MED ORDER — CEPHALEXIN 500 MG PO CAPS
500.0000 mg | ORAL_CAPSULE | Freq: Once | ORAL | Status: AC
  Administered 2023-07-11: 500 mg via ORAL
  Filled 2023-07-11: qty 1

## 2023-07-11 MED ORDER — NAPROXEN 500 MG PO TABS: 500.0000 mg | ORAL_TABLET | Freq: Two times a day (BID) | ORAL | 0 refills | Status: AC

## 2023-07-11 MED ORDER — CEPHALEXIN 500 MG PO CAPS: 1000.0000 mg | ORAL_CAPSULE | Freq: Two times a day (BID) | ORAL | 0 refills | Status: AC

## 2023-07-11 MED ORDER — OXYCODONE-ACETAMINOPHEN 5-325 MG PO TABS
1.0000 | ORAL_TABLET | Freq: Once | ORAL | Status: AC
  Administered 2023-07-11: 1 via ORAL
  Filled 2023-07-11: qty 1

## 2023-07-11 MED ORDER — IBUPROFEN 800 MG PO TABS
800.0000 mg | ORAL_TABLET | Freq: Once | ORAL | Status: AC
  Administered 2023-07-11: 800 mg via ORAL
  Filled 2023-07-11: qty 1

## 2023-07-11 NOTE — ED Triage Notes (Signed)
Pt c/o left lower leg redness and swelling on the front of his leg, unknown if he got bit by something. Pt also c/o upper thigh pain as well.

## 2023-07-11 NOTE — ED Provider Notes (Signed)
[place] EMERGENCY DEPARTMENT AT [facility] Provider Note   CSN: [id number] Arrival date & time: 07/11/23  0426     History Chief Complaint  Patient presents with   Leg Pain    Danny English is a 47 y.o. male.  Patient without significant past medical history presents to the emergency department concerns of a rash on his left lower leg.  Reports that he noted some redness and swelling to the anterior portion of his left lower leg.  Does not believe that he was bit by anything.  Reports that this area of redness has been present for about 1-1/2 days.  Also endorsing some pain in his left upper thigh.  No rash in this area.  Previously was diagnosed with cellulitis approximately 3 years ago in similar area.  Denies any history of diabetes.  Endorses some subjective fever and chills at home but nothing measured recorded.  Has not been on any antibiotics for this.   Leg Pain      Home Medications Prior to Admission medications   Medication Sig Start Date End Date Taking? Authorizing Provider  cephALEXin (KEFLEX) 500 MG capsule Take 2 capsules (1,000 mg total) by mouth 2 (two) times daily for 5 days. 07/11/23 07/16/23 Yes Smitty Knudsen, PA-C  naproxen (NAPROSYN) 500 MG tablet Take 1 tablet (500 mg total) by mouth 2 (two) times daily for 5 days. 07/11/23 07/16/23 Yes Smitty Knudsen, PA-C  diclofenac sodium (VOLTAREN) 1 % GEL Apply 2 g topically 4 (four) times daily. 03/28/19   Caccavale, Sophia, PA-C  doxycycline (VIBRAMYCIN) 100 MG capsule Take 1 capsule (100 mg total) by mouth 2 (two) times daily. 12/01/21   Gustavus Bryant, FNP  ibuprofen (ADVIL) 800 MG tablet Take 1 tablet (800 mg total) by mouth 3 (three) times daily. 09/21/22   Ellsworth Lennox, PA-C      Allergies    Patient has no known allergies.    Review of Systems   Review of Systems  Skin:  Positive for rash.  All other systems reviewed and are negative.   Physical Exam Updated Vital Signs BP (!)  146/97 (BP Location: Right Arm)   Pulse 94   Temp 100.1 F (37.8 C) (Oral)   Resp 17   Ht 5\' 5"  (1.651 m)   Wt 111.6 kg   SpO2 95%   BMI 40.94 kg/m  Physical Exam Vitals and nursing note reviewed.  Constitutional:      General: He is not in acute distress.    Appearance: He is well-developed.  HENT:     Head: Normocephalic and atraumatic.  Eyes:     Conjunctiva/sclera: Conjunctivae normal.  Cardiovascular:     Rate and Rhythm: Normal rate and regular rhythm.     Heart sounds: No murmur heard. Pulmonary:     Effort: Pulmonary effort is normal. No respiratory distress.     Breath sounds: Normal breath sounds.  Abdominal:     Palpations: Abdomen is soft.     Tenderness: There is no abdominal tenderness.  Musculoskeletal:        General: No swelling.     Cervical back: Neck supple.     Comments: Some tenderness to palpation in the left upper inner thigh.  Pain worsened with motion.  Skin:    General: Skin is warm and dry.     Capillary Refill: Capillary refill takes less than 2 seconds.     Findings: Erythema present.  Comments: Area of erythema, and warmth to the left anterior shin.  No appreciable fluctuance or area concerning for abscess.  No rash present in upper thigh of left leg.  Neurological:     Mental Status: He is alert.  Psychiatric:        Mood and Affect: Mood normal.     ED Results / Procedures / Treatments   Labs (all labs ordered are listed, but only abnormal results are displayed) Labs Reviewed - No data to display  EKG None  Radiology No results found.  Procedures Procedures   Medications Ordered in ED Medications  cephALEXin (KEFLEX) capsule 500 mg (500 mg Oral Given 07/11/23 0450)  ibuprofen (ADVIL) tablet 800 mg (800 mg Oral Given 07/11/23 0450)  oxyCODONE-acetaminophen (PERCOCET/ROXICET) 5-325 MG per tablet 1 tablet (1 tablet Oral Given 07/11/23 0450)    ED Course/ Medical Decision Making/ A&P                                  Medical Decision Making Risk Prescription drug management.   This patient presents to the ED for concern of leg pain.  Differential diagnosis includes cellulitis, sepsis, DVT, insect bite   Medicines ordered and prescription drug management:  I ordered medication including Keflex, ibuprofen, Percocet for cellulitis, pain, fever Reevaluation of the patient after these medicines showed that the patient improved I have reviewed the patients home medicines and have made adjustments as needed   Problem List / ED Course:  Patient without past medical history presents the emergency department concerns of leg pain and rash.  Reports he noted some redness and swelling to the left lower extremity about 1.5 days ago.  Also endorsing subjective fevers at home.  Previously diagnosed with cellulitis in the left lower extremity several years ago.  States that this feels similar to that time.  Has not been on any antibiotics. Physical exam appears consistent with cellulitis of the left lower extremity.  Area is erythematous and warm to the touch.  No active draining or abscesses noted.  Patient is slightly febrile at 100.6 agrees Fahrenheit.  Will initiate treatment here in the emergency department dose of Keflex as well as Percocet for pain and ibuprofen for fever. Patient reports improvement in pain with dose of Percocet and ibuprofen.  Fever downtrending to 100.1. Wells score 0, doubt DVT. Advised strict return precautions with patient and close follow up with PCP. Patient agreeable with treatment plan and verbalized understanding return precautions. All questions answered prior to patient discharge. Discharged home in stable condition.  Final Clinical Impression(s) / ED Diagnoses Final diagnoses:  Cellulitis of left lower extremity    Rx / DC Orders ED Discharge Orders          Ordered    cephALEXin (KEFLEX) 500 MG capsule  2 times daily        07/11/23 0537    naproxen (NAPROSYN) 500 MG tablet   2 times daily        07/11/23 0537              Smitty Knudsen, PA-C 07/11/23 0540    Melene Plan, DO 07/11/23 [phone]

## 2023-07-11 NOTE — Discharge Instructions (Signed)
You are seen in the emergency department concerns of leg pain.  Your leg has a rash on it which appears consistent with cellulitis.  You are started on Keflex with a prescription was antibiotic sent to your pharmacy.  If you have any rapid spread of this rash, return the emergency department.  Otherwise, take the medication as prescribed and follow-up with your primary care provider.
